# Patient Record
Sex: Male | Born: 1945 | Race: White | Hispanic: No | Marital: Married | Smoking: Never smoker
Health system: Southern US, Community
[De-identification: ages and names within clinical notes are randomized; demographics above are authoritative.]

## PROBLEM LIST (undated history)

## (undated) DIAGNOSIS — E785 Hyperlipidemia, unspecified: Secondary | ICD-10-CM

## (undated) DIAGNOSIS — R748 Abnormal levels of other serum enzymes: Secondary | ICD-10-CM

## (undated) DIAGNOSIS — I2510 Atherosclerotic heart disease of native coronary artery without angina pectoris: Secondary | ICD-10-CM

## (undated) HISTORY — DX: Atherosclerotic heart disease of native coronary artery without angina pectoris: I25.10

## (undated) HISTORY — PX: KNEE SURGERY: SHX244

## (undated) HISTORY — PX: VASECTOMY: SHX75

## (undated) HISTORY — DX: Hyperlipidemia, unspecified: E78.5

## (undated) HISTORY — PX: EYE SURGERY: SHX253

## (undated) HISTORY — PX: TONSILLECTOMY: SHX5217

## (undated) HISTORY — DX: Abnormal levels of other serum enzymes: R74.8

---
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
## 2002-08-18 ENCOUNTER — Ambulatory Visit (HOSPITAL_COMMUNITY): Admission: RE | Admit: 2002-08-18 | Discharge: 2002-08-18 | Payer: Self-pay | Admitting: Gastroenterology

## 2003-12-25 ENCOUNTER — Ambulatory Visit (HOSPITAL_COMMUNITY): Admission: RE | Admit: 2003-12-25 | Discharge: 2003-12-26 | Payer: Self-pay | Admitting: Cardiovascular Disease

## 2003-12-25 HISTORY — PX: CORONARY ANGIOPLASTY WITH STENT PLACEMENT: SHX49

## 2004-09-22 ENCOUNTER — Inpatient Hospital Stay (HOSPITAL_COMMUNITY): Admission: EM | Admit: 2004-09-22 | Discharge: 2004-09-27 | Payer: Self-pay | Admitting: Emergency Medicine

## 2004-09-23 HISTORY — PX: CARDIAC CATHETERIZATION: SHX172

## 2004-09-26 HISTORY — PX: CORONARY ANGIOPLASTY: SHX604

## 2004-11-17 ENCOUNTER — Ambulatory Visit: Payer: Self-pay | Admitting: Cardiology

## 2009-07-18 ENCOUNTER — Emergency Department (HOSPITAL_BASED_OUTPATIENT_CLINIC_OR_DEPARTMENT_OTHER): Admission: EM | Admit: 2009-07-18 | Discharge: 2009-07-18 | Payer: Self-pay | Admitting: Emergency Medicine

## 2009-07-18 ENCOUNTER — Ambulatory Visit: Payer: Self-pay | Admitting: Diagnostic Radiology

## 2010-08-24 ENCOUNTER — Ambulatory Visit: Payer: Self-pay | Admitting: Cardiovascular Disease

## 2010-08-26 ENCOUNTER — Ambulatory Visit: Payer: Self-pay | Admitting: Cardiovascular Disease

## 2010-09-28 ENCOUNTER — Ambulatory Visit: Payer: Self-pay | Admitting: Cardiovascular Disease

## 2010-11-08 ENCOUNTER — Ambulatory Visit: Payer: Self-pay | Admitting: Cardiovascular Disease

## 2011-03-17 NOTE — Op Note (Signed)
   NAMEKERMITT, William Bernard                             ACCOUNT NO.:  [id number]   MEDICAL RECORD NO.:  [id number]                   PATIENT TYPE:  AMB   LOCATION:  ENDO                                 FACILITY:  [facility]   PHYSICIAN:  James L. Malon Kindle., M.D.          DATE OF BIRTH:  January 12, 1946   DATE OF PROCEDURE:  08/18/2002  DATE OF DISCHARGE:                                 OPERATIVE REPORT   PROCEDURE:  Colonoscopy.   MEDICATIONS:  Fentanyl 50 mcg, Versed 5 mg IV.   INDICATIONS:  Colon cancer screening.   DESCRIPTION OF PROCEDURE:  The procedure had been explained to the patient  and consent obtained.  With the patient in the left lateral decubitus  position, the Olympus adult video colonoscope inserted, advanced under  direct visualization.  Prep excellent.  We were able to reach the cecum  without difficulty.  The ileocecal valve and appendiceal orifice seen.  Scope withdrawn and cecum, ascending colon, hepatic flexure, transverse  colon, splenic flexure, descending and sigmoid colon were seen well.  No  polyps seen, no significant diverticular disease.  Small internal  hemorrhoids in the rectum.  The scope withdrawn.  The patient tolerated the  procedure well.   ASSESSMENT:  Essentially normal screening colonoscopy.   PLAN:  Obtain yearly Hemoccults.  Consider another screening test in 10  years if appropriate.                                               James L. Malon Kindle., M.D.    Waldron Session  D:  08/18/2002  T:  08/18/2002  Job:  [id number]   cc:   Caryn Bee L. Little, M.D.  [address]  [place]  [place] [address]  Fax: [phone]

## 2011-03-17 NOTE — Cardiovascular Report (Signed)
William Bernard, William Bernard NO.:  [id number]   MEDICAL RECORD NO.:  [id number]          PATIENT TYPE:  INP   LOCATION:  3739                         FACILITY:  [facility]   PHYSICIAN:  Vesta Mixer, M.D. DATE OF BIRTH:  1946/04/30   DATE OF PROCEDURE:  09/26/2004  DATE OF DISCHARGE:                              CARDIAC CATHETERIZATION   INDICATIONS:  William Bernard is a 65 year old gentleman with a history of  coronary artery disease.  He is status post PTCA and stent placement to his  proximal mid LAD in February of this year.  After he took Plavix for nine  months we stopped the Plavix.  This past weekend he developed chest pain  after running.  He was brought into the catheterization laboratory and was  found to have a subtotal occlusion of his LAD with a subacute thrombosis of  his stent.  He did have antegrade flow at that time.  He was placed on  Integrilin and heparin through the weekend and was brought back today for a  relook.   PROCEDURE:  Left heart catheterization with a PTCA and cutting balloon  procedure to the mid and proximal left anterior descending with PTCA of the  first diagonal vessel.   The right femoral artery was easily cannulated using a modified Seldinger  technique.  The patient received 3200 units of heparin followed by an  additional 1500 units bolus.  He was continued on his Integrilin drip.   ANGIOGRAPHY:  The patient was found to have a 75% stenosis located within  the stent.  The very large thrombus burden has resolved.  The patient was  noted also to have a 95% stenosis in the origin of his first diagonal  vessel.  This is unchanged from his previous angiograms.   The patient received heparin dose as noted above.   The left anterior descending artery was wired using a BMW wire.  A 3.25 x 15  mm cutting balloon was positioned inside the stent.  It was inflated up to 7  atmospheres on four different occasions.  This resulted in a marked  improvement of the vessel lumen.  There was no evidence of embolization.   At this point the wire was placed down into the first diagonal vessel.  A  1.5 x 12 mm Voyager was placed down into the proximal diagonal vessel.  It  was inflated up to 14 atmospheres on two different occasions.  This resulted  in some improvement of the vessel lumen.  This 1.5 mm balloon was removed  and a 2.5 x 12 mm Voyager was positioned back down across the stent out into  the diagonal.  It was inflated up to 6 atmospheres for 32 seconds followed  by 8 atmospheres for 60 seconds.  This resulted in some improvement of the  vessel lumen.  The stenosis was improved from approximately 99% down to 50%.  There was a loss of elastic recoil that limited the amount of success.  I  did not want to place a stent because of location next to the main LAD  stent.  The patient is stable after leaving the laboratory.   COMPLICATIONS:  None.   CONCLUSION:  1.  Successful PTCA and cutting balloon of the proximal left anterior      descending artery.  2.  Partially successful angioplasty involving the first diagonal vessel.      We will continue with Integrilin, Plavix.  He will likely have to take      Plavix lifelong.       PJN/MEDQ  D:  09/26/2004  T:  09/26/2004  Job:  [id number]   cc:   Vikki Ports, M.D.  [address]  [place]  [place] [address]  Fax: [phone]

## 2011-03-17 NOTE — H&P (Signed)
NAMEOMARIE, PARCELL NO.:  [id number]   MEDICAL RECORD NO.:  [id number]                   PATIENT TYPE:  OIB   LOCATION:                                       FACILITY:  [facility]   PHYSICIAN:  Vesta Mixer, M.D.              DATE OF BIRTH:  1946-04-02   DATE OF ADMISSION:  12/25/2003  DATE OF DISCHARGE:                                HISTORY & PHYSICAL   HISTORY OF PRESENT ILLNESS:  William Bernard is a middle aged gentleman with a  history of hyperlipidemia and a family history of coronary artery disease.  He is admitted to the hospital for heart catheterization after having  positive stress Cardiolite study.   Mr. Even is a very healthy middle aged gentleman.  He has been an avid  runner and has run several marathons throughout his life.  For the past  several months he has had episodes of chest pain that bother him with  exercise.  He says that these episodes of chest pain are described as a  vague chest discomfort which is centered in the chest.  It typically occurs  after a few minutes of exercise and then gradually works itself out.  He  usually has to slow down in his running and then when the pain goes away he  is able to resume his running usually without return of the chest pains.  These chest pains have been occurring for several weeks.   He denies any syncope of presyncope.  He does not have any significant  shortness of breath.  He works as in a fairly active job and has not had any  limitations on his job.   CURRENT MEDICATIONS:  1. Aspirin 81 mg a day.  2. Multivitamin once a day.   ALLERGIES:  NONE.   PAST MEDICAL HISTORY:  Hyperlipidemia.   FAMILY HISTORY:  Positive for coronary artery disease.   SOCIAL HISTORY:  The patient does not smoke.  He works for [employer].   REVIEW OF SYSTEMS:  His review of systems was reviewed and is essentially  negative.   PHYSICAL EXAMINATION:  GENERAL:  He is a middle aged gentleman in no  acute  distress.  He is alert and oriented x3 and his mood and affect are normal.  VITAL SIGNS:  Weight is 140 pounds, blood pressure 110/70 with a heart rate  of 54.  HEENT:  2+ carotids, there are no bruits, there is no JVD, no thyromegaly.  LUNGS:  Clear to auscultation.  HEART:  Regular rate, S1 and S2, no murmurs, rubs or gallops.  ABDOMEN:  Good bowel sounds, nontender.  EXTREMITIES:  He has no clubbing, cyanosis or edema.  NEUROLOGIC:  Nonfocal.  Cranial nerves II-XII are intact and his motor and  sensory functions are intact.   His EKG reveals normal sinus rhythm.  He has Q waves in  leads aVL as well as  some T wave inversions.   His stress Cardiolite study reveals anterior apical ischemia with well-  preserved left ventricular systolic function.  His ejection fraction is 50%.   IMPRESSION AND PLAN:  Khalin Royce presents with some episodes of chest pain  and an abnormal Cardiolite study.  The Cardiolite study results were  somewhat surprising given his exercise capacity.  It does show evidence of  reperfusion, however.  We have discussed heart catheterization.  We have  discussed the risks, benefits, and options.  He understands and agrees to  proceed.                                                Vesta Mixer, M.D.    PJN/MEDQ  D:  12/23/2003  T:  12/24/2003  Job:  [id number]   cc:   Vikki Ports, M.D.  [address]  [place]  [place] [address]  Fax: [phone]

## 2011-03-17 NOTE — Cardiovascular Report (Signed)
William Bernard, William Bernard                             ACCOUNT NO.:  [id number]   MEDICAL RECORD NO.:  [id number]                   PATIENT TYPE:  OIB   LOCATION:  2858                                 FACILITY:  [facility]   PHYSICIAN:  Vesta Mixer, M.D.              DATE OF BIRTH:  January 06, 1946   DATE OF PROCEDURE:  12/25/2003  DATE OF DISCHARGE:                              CARDIAC CATHETERIZATION   PROCEDURE:  Left heart catheterization with coronary angiography with  percutaneous transluminal coronary angioplasty and stenting of the left  anterior descending coronary artery. The right femoral artery was easily  cannulated using the modified Seldinger technique.   HEMODYNAMICS:  The LV pressure was 122/5 with an aortic pressure of 122/62.   ANGIOGRAPHY:  1. The left main coronary artery was fairly normal.  2. The left anterior descending coronary artery had very minor irregularity     in the proximal  segment between 20% and 25%. There was a diffuse     stenosis of approximately 30 mm in length. The stenosis started off as a     20% to 30% and then got as tight as 90% to 95% stenosis at the 1st     diagonal branch. There was a thin 50% stenosis extending down past the     septal branch. The 1st diagonal vessel was a moderate to large vessel.     There is a 95% stenosis with TIMI grade 2 flow down that vessel. This     diagonal branch originates right in the middle of the tightest part of     the LAD stenosis/plaque.  3. The left circumflex artery is a large vessel. There are minor luminal     irregularities. The 1st obtuse marginal artery is normal.  4. The ramus intermediate artery is a large vessel and is essentially     normal.  5. The right coronary artery is a large vessel. The posterior descending     coronary artery and the posterolateral segment arteries are normal.  6. The left ventriculogram was performed in the 30 RAO position. It revealed     mild hypokinesis of the anterior  wall with an ejection fraction of 50%.   Percutaneous transluminal coronary angioplasty:  The patient was given  Angiomax as well as 300 mg of Plavix. The 6 [place]  system was traded out  for the 7 [place]  system. A BMW wire was positioned in the distal LAD. Then  a 2nd BMW wire was used in an attempt to cross into the diagonal vessel. We  were not able to cross into this branch vessel.   We then took a 2.75 x 32-mm Taxis stent. It was positioned across the whole  entire length of the LAD stenosis. It was deployed at 14 atmospheres for 28  seconds. We then followed  this with a 3.0 x 15-mm Quantum Maverick. It was  then inflated up to 14 atmospheres distally and then 16 atmospheres  proximally for 15 seconds a piece. We once again attempted to cross into the  diagonal stenosis, but were unable to despite 2 different wires. The flow  down this diagonal vessel is perhaps a TIMI grade 1 at this point. There  were no complications.   CONCLUSION:  Single vessel coronary artery disease involving the LAD and  diagonal system. He is status post  successful  PTCA and stenting of the  LAD. We were not able to get out the diagonal vessel. The patient is  currently  asymptomatic. He may have some chest pain if this small  diagonal vessel  closes. There would not be much to do from an interventional standpoint,  since we were not able to get out that vessel, so we will continue with  medical management.                                               Vesta Mixer, M.D.    PJN/MEDQ  D:  12/25/2003  T:  12/26/2003  Job:  [id number]   cc:   Caryn Bee L. Little, M.D.  [address]  [place]  [place] [address]  Fax: [phone]   Vesta Mixer, M.D.  [address]  [place]  [place] [address]  Fax: [phone]

## 2011-03-17 NOTE — H&P (Signed)
NAMEDELLAS, GUARD NO.:  [id number]   MEDICAL RECORD NO.:  [id number]          PATIENT TYPE:  EMS   LOCATION:  MAJO                         FACILITY:  [facility]   PHYSICIAN:  Darden Palmer., M.D.DATE OF BIRTH:  04-08-1946   DATE OF ADMISSION:  09/22/2004  DATE OF DISCHARGE:                                HISTORY & PHYSICAL   REASON FOR ADMISSION:  Chest pain.   HISTORY:  The patient is a 65 year old male who previously has been in  excellent health, except for hyperlipidemia.  He developed exertional chest  pain and had an abnormal Cardiolite scan with anteroapical ischemia noted.  Catheterization on December 25, 2003, showed a long segmental LAD stenosis  involving the first diagonal branch.  The circumflex, intermediate and right  coronary artery were without disease.  He had stenting of the LAD with a  2.75 x 32 mm Taxus stent and post dilated with a 3.0 x 15 mm balloon.  Dr.  Elease Hashimoto at that time was unable to wire the diagonal branch and was unable to  cross it even after stent placement.  The patient was maintained on Plavix  until about one month ago, at which point he discontinued it.  The patient  has been a avid runner and runs heavily.  He went out today and ran 10 miles  [place].  On returning back from the mile, he developed upper anterior  chest discomfort that persisted.  I should also note that he had done some  pushups after he finished his run.  The chest discomfort persisted and he  took a nitroglycerin and then called.  The discomfort may have abated  somewhat after he called me, but he was advised to call EMS since he was  having ongoing chest pain.  After EMS arrived, they placed oxygen on him and  the pain went away.  He is currently pain-free.  His initial cardiac markers  showed a slightly elevated myoglobin, but troponin and CPK were negative.  He is currently pain-free.  EKG was unremarkable.   PAST HISTORY:  Relatively  benign.  He is a history of hyperlipidemia.  There  is no prior history of hypertension or diabetes.   PREVIOUS SURGICAL HISTORY:  He has had previous knee surgery, as well as  tonsillectomy.   ALLERGIES:  None.   CURRENT MEDICATIONS:  1.  Aspirin 81 mg daily.  2.  Vytorin 10/40 mg daily.   SOCIAL HISTORY:  The patient is a nonsmoker and lives at home with his wife  and two children.  He is retired from [employer] and has recently retired from the  [employer].   FAMILY HISTORY:  Reportedly positive for cardiac disease.   REVIEW OF SYSTEMS:  He has occasional indigestion and dyspepsia.  He denies  any urinary symptoms.  He has no significant arthritis at the present time.  He has no TIAs.  He denies any eye problems and ears, nose and throat  problems.  No skin disorders or problems.  He has not had any recurrence of  chest  pain, palpitations, PND, orthopnea or edema since his catheterization  until today.   PHYSICAL EXAMINATION:  GENERAL APPEARANCE:  He is a pleasant, middle-aged  male who looks physically fit and healthy and is in no acute distress.  VITAL SIGNS:  His blood pressure is currently 110/70 with a pulse of 54.  SKIN:  Warm and dry without obvious mass or lesions.  HEENT:  EOMI.  PERRLA.  CNS clear.  Fundi not examined.  Pharynx negative.  NECK:  Supple without masses.  No JVD, thyromegaly or bruits.  LUNGS:  Clear to A&P.  CARDIOVASCULAR:  Normal S1 and S2.  No S3, S4 or murmur.  ABDOMEN:  Soft and nontender.  No masses, hepatosplenomegaly or aneurysm.  EXTREMITIES:  Femoral and distal pulses 2+.   LABORATORY DATA:  EKG is normal.  Initial point of care enzymes show a white  count of 4600.  Chemistry panel showed slight elevation of SGOT.  Myoglobin  is 233.  CPK-MB and troponin were negative.   IMPRESSION:  1.  Prolonged chest discomfort following prolonged physical exertion, rule      out myocardial infarction or unstable angina.  2.  Coronary artery disease  with previous placement of a long Taxus drug-      eluting stent in the proximal LAD with a jailed diagonal branch      previously.  3.  Hyperlipidemia under treatment.  4.  Elevation of myoglobin that may be due to his long run versus myocardial      ischemia.   RECOMMENDATIONS:  The patient will be begun on Lovenox.  He will be given  beta blockers and aspirin and loaded with Plavix.  Serial markers will be  obtained.  I think he will need to have repeat catheterization to exclude a  problem with his stent, such as late stent thrombosis or restenosis.  He  does have a jailed diagonal branch.      Kristine Royal   WST/MEDQ  D:  09/22/2004  T:  09/22/2004  Job:  [id number]   cc:   Vesta Mixer, M.D.  [address]  [place]  [place] [address]  Fax: [phone]   Anna Genre. Little, M.D.  [address]  [place]  [place] [address]  Fax: [phone]

## 2011-03-17 NOTE — Discharge Summary (Signed)
William Bernard, William Bernard NO.:  [id number]   MEDICAL RECORD NO.:  [id number]          PATIENT TYPE:  INP   LOCATION:  6532                         FACILITY:  [facility]   PHYSICIAN:  Vesta Mixer, M.D. DATE OF BIRTH:  09/12/1946   DATE OF ADMISSION:  09/22/2004  DATE OF DISCHARGE:  09/27/2004                                 DISCHARGE SUMMARY   DISCHARGE DIAGNOSES:  1.  Acute anterior wall myocardial infarction caused by subacute thrombosis      of previously placed left anterior descending stent.  2.  Successful percutaneous transluminal coronary angiography/cutting      balloon angioplasty to the left anterior descending stent.  3.  Dyslipidemia.   DISCHARGE MEDICATIONS:  1.  Vytorin 10/80 mg once a day.  2.  Plavix 75 mg a day.  3.  Enteric-coated aspirin 325 mg a day.  4.  Nitroglycerin 0.4 mg sublingually as needed.  5.  Coenzyme Q10 once a day.   FOLLOWUP:  The patient will return to see Dr. Elease Hashimoto in one to two weeks.  We will consider adding Niaspan 500 mg at night at that time.   DIET:  He is to eat a low-fat, low-cholesterol diet.   ACTIVITY:  He is to resume his exercise.   HISTORY:  [name] is a 65 year old gentleman with a history of coronary  artery disease.  He is status post PTCA and stenting of his left anterior  descending artery in February of this year.  He returned on September 22, 2004, with episodes of chest pain.  Please see the dictated H&P for further  details.   HOSPITAL COURSE BY PROBLEMS:  #1 - CORONARY ARTERY DISEASE:  The patient  stabilized fairly well with medical therapy.  On the following day, he was  taken to the catheterization laboratory by Dr. Donnie Aho.  He was found to have  a 95-99% stenosis involving his LAD stent.  There was a large thrombus  burden located in the stent.  There were minimal luminal irregularities  elsewhere.  The patient was placed on Lovenox and Integrilin for the next  three days.  He was taken  back to the laboratory on September 26, 2004, where  he was found to have almost complete resolution of the LAD thrombus, but he  still had a significant plaque representing a 75% stenosis.  He underwent  cutting balloon angioplasty to the LAD stent.   He had had a severe stenosis in the first diagonal vessel since the original  angioplasty earlier this year.  We were able to wire the diagonal vessel and  place a small 1.5 mm balloon and then later a 2.5 mm balloon down into the  diagonal vessel.  The 2.5 mm balloon was expanded up to 8 atmospheres.  We  were able to get a good dilatation of the balloon, but there was lots of  elastic recoil in the proximal aspect of this diagonal vessel.  The end  result was that we had a partially successful angioplasty of the first  diagonal vessel with a successful cutting  balloon of the LAD stent.  The  patient was sent to the [facility] in satisfactory condition.  He will be  discharged on the above noted medications and disposition.  All of his other  medical problems are stable.   #2 - HYPERLIPIDEMIA:  The patient's most recent LDL cholesterol is 92.  This  was on Vytorin 10/40 mg.  We will increase his Vytorin to 10/80 mg.  We will  consider adding Niaspan 500 mg q.h.s. to his medical regimen.       PJN/MEDQ  D:  09/27/2004  T:  09/27/2004  Job:  [id number]   cc:   Vikki Ports, M.D.  [address]  [place]  [place] [address]  Fax: [phone]

## 2011-03-17 NOTE — Cardiovascular Report (Signed)
William Bernard, William Bernard NO.:  [id number]   MEDICAL RECORD NO.:  [id number]          PATIENT TYPE:  INP   LOCATION:  3739                         FACILITY:  [facility]   PHYSICIAN:  W. Ashley Royalty., M.D.DATE OF BIRTH:  [birth year]   DATE OF PROCEDURE:  09/23/2004  DATE OF DISCHARGE:                              CARDIAC CATHETERIZATION   INDICATIONS:  A 65 year old male with previous stent involving the LAD with  a jailed diagonal branch in February.  He had discontinued Plavix on his  physician's orders about one month ago.  After a 10-mile run and doing some  pushups, he had the onset of anterior chest pain and was brought to the  emergency room.  He had mild elevations of myoglobin, CPK-MB, and troponin I  and was brought to the catheterization lab.   COMMENTS ABOUT PROCEDURE:  The patient tolerated the procedure well without  complications and had good hemostasis and peripheral pulses noted at the end  of the procedure.   Valium 5 mg p.o. and Versed 2 mg IV were used for sedation.   HEMODYNAMIC DATA:  Aorta post contrast 130/73, LV post contrast 130/5 to 12.   ANGIOGRAPHIC DATA:  1.  Left ventriculogram:  Performed in the 30 degree RAO projection.  The      aortic valve was normal.  The mitral valve was normal.  The left      ventricle appears normal in size.  The estimated ejection fraction is      60%.  Coronary arteries arise and distribute normally.  Calcification is      noted in the left anterior descending next to the stent.  2.  Left main coronary artery is normal.  3.  Left anterior descending has a 40% stenosis at the ostium.  The stent is      seen and appears to be well deployed.  In the mid portion of the stent      is a segmental thrombus which is about 70% obstructive and is best seen      in the straight and lateral view but can be seen in all other views.  4.  The diagonal branch that was previously jailed has a 90% ostial stenosis  noted.  The distal portion of the stent is well opposed without      thrombosis.  The proximal portion is without thrombosis, and the      thrombosis is localized and is involving the mid portion of the stent.  5.  The large intermediate branch arises and has a proximal 30% stenosis.  6.  The circumflex has irregularities to it.  7.  The right coronary artery is a dominant vessel.  There is mild 30%      proximal stenosis.  There is a eccentric ulcerated plaque estimated at      20 to 30% in the mid portion of the vessel.   IMPRESSION:  1.  Subacute thrombosis of the previous long Taxus stent to the mid LAD with      residual thrombus.  2.  Residual disease involving  the ostium of the first diagonal branch.  3.  Ulcerated plaque involving the right coronary artery.   RECOMMENDATIONS:  1.  The patient will be taken off the catheterization table.  2.  We will begin him on Integrilin, restart Lovenox.  3.  Have a re-look next week to see if the thrombus has dissolved or if he      will need to have thrombectomy of this      stented segment later on.  4.  The other possibility would be consideration of bypass grafting to both      the diagonal branch as well as the LAD.      Kristine Royal   WST/MEDQ  D:  09/23/2004  T:  09/23/2004  Job:  [id number]   cc:   Vesta Mixer, M.D.  [address]  [place]  [place] [address]  Fax: [phone]   Anna Genre. Little, M.D.  [address]  [place]  [place] [address]  Fax: [phone]

## 2011-05-09 ENCOUNTER — Other Ambulatory Visit: Payer: Self-pay | Admitting: *Deleted

## 2011-05-09 DIAGNOSIS — E785 Hyperlipidemia, unspecified: Secondary | ICD-10-CM

## 2011-05-17 ENCOUNTER — Encounter: Payer: Self-pay | Admitting: *Deleted

## 2011-05-19 ENCOUNTER — Other Ambulatory Visit (INDEPENDENT_AMBULATORY_CARE_PROVIDER_SITE_OTHER): Payer: Managed Care, Other (non HMO) | Admitting: *Deleted

## 2011-05-19 ENCOUNTER — Encounter: Payer: Self-pay | Admitting: Cardiovascular Disease

## 2011-05-19 ENCOUNTER — Ambulatory Visit (INDEPENDENT_AMBULATORY_CARE_PROVIDER_SITE_OTHER): Payer: Managed Care, Other (non HMO) | Admitting: Cardiovascular Disease

## 2011-05-19 VITALS — BP 120/80 | HR 60 | Ht 68.5 in | Wt 138.2 lb

## 2011-05-19 DIAGNOSIS — E785 Hyperlipidemia, unspecified: Secondary | ICD-10-CM

## 2011-05-19 DIAGNOSIS — I2510 Atherosclerotic heart disease of native coronary artery without angina pectoris: Secondary | ICD-10-CM

## 2011-05-19 LAB — BASIC METABOLIC PANEL
BUN: 26 mg/dL — ABNORMAL HIGH (ref 6–23)
CO2: 29 mEq/L (ref 19–32)
Calcium: 9.3 mg/dL (ref 8.4–10.5)
Chloride: 106 mEq/L (ref 96–112)
Creatinine, Ser: 0.9 mg/dL (ref 0.4–1.5)
GFR: 92.49 mL/min (ref >60.00)
Glucose, Bld: 88 mg/dL (ref 70–99)
Potassium: 4.8 mEq/L (ref 3.5–5.1)
Sodium: 141 mEq/L (ref 135–145)

## 2011-05-19 LAB — CK: Total CK: 308 U/L — ABNORMAL HIGH (ref 7–232)

## 2011-05-19 LAB — LIPID PANEL
Cholesterol: 193 mg/dL (ref 0–200)
HDL: 83.9 mg/dL (ref >39.00)
LDL Cholesterol: 101 mg/dL — ABNORMAL HIGH (ref 0–99)
Total CHOL/HDL Ratio: 2
Triglycerides: 42 mg/dL (ref 0.0–149.0)
VLDL: 8.4 mg/dL (ref 0.0–40.0)

## 2011-05-19 LAB — HEPATIC FUNCTION PANEL
ALT: 26 U/L (ref 0–53)
AST: 43 U/L — ABNORMAL HIGH (ref 0–37)
Albumin: 4.3 g/dL (ref 3.5–5.2)
Alkaline Phosphatase: 55 U/L (ref 39–117)
Bilirubin, Direct: 0.1 mg/dL (ref 0.0–0.3)
Total Bilirubin: 1.2 mg/dL (ref 0.3–1.2)
Total Protein: 7.5 g/dL (ref 6.0–8.3)

## 2011-05-19 NOTE — Progress Notes (Signed)
William Bernard Date of Birth  06/15/1946 [facility] [address]     [address] [place], [place]  [address] [phone]  Fax  [phone]  History of Present Illness:  Hx of CAD. S/p stent  And then later a cutting balloon to the  LAD.  Has had reactions to statins but has tolerated atorvastatin 20 QOD.  No muscle weakness.   Current Outpatient Prescriptions  Medication Sig Dispense Refill  . aspirin 81 MG tablet Take 81 mg by mouth daily.        [name] atorvastatin (LIPITOR) 20 MG tablet Take 20 mg by mouth daily. Takes every other day       . Calcium Carbonate-Vitamin D (CALCIUM + D PO) Take 1 tablet by mouth daily.        . clopidogrel (PLAVIX) 75 MG tablet Take 75 mg by mouth daily.        . Coenzyme Q10 (COQ-10 PO) Take by mouth daily.        . fish oil-omega-3 fatty acids 1000 MG capsule Take 1,000 mg by mouth 3 (three) times daily.        [name] KRILL OIL PO Take 1 capsule by mouth daily.        . niacin (NIASPAN) 1000 MG CR tablet Take 1,000 mg by mouth at bedtime.          No Active Allergies He has profound muscle weakness with statins.  Past Medical History  Diagnosis Date  . Coronary artery disease     post PTCA and stenting of his LAD in 2005, he is statuts post Cutting Balloon atherectomy of the stent in November 2005    . Hyperlipidemia   . Elevated CPK   . Elevated liver enzymes     due to multiple statin medication    Past Surgical History  Procedure Date  . Coronary angioplasty with stent placement 12/25/2003    Est. EF of 50%. -- stenting of LAD -- Single vessel coronary artery disease involving the LAD and diagonal system   . Cardiac catheterization 09/23/2004    Subacute thrombosis of the previous long Taxus stent to the mid LAD with residual thrombus -- Residual disease involving the ostium of the first diagonal branch -- Ulcerated plaque involving the right coronary artery.  . Coronary angioplasty 09/26/2004   Successful PTCA and cutting balloon of the proximal left anterior descending artery --  Partially successful angioplasty involving the first diagonal vessel We will continue with Integrilin, Plavix.  He will likely have to take Plavix lifelong  . Knee surgery   . Tonsillectomy   . Vasectomy   . Eye surgery     History  Smoking status  . Never Smoker   Smokeless tobacco  . Not on file    History  Alcohol Use No    Family History  Problem Relation Age of Onset  . Heart attack Father     Reviw of Systems:  Reviewed in the HPI.  All other systems are negative.  Physical Exam: BP 120/80  Pulse 60  Ht 5' 8.5" (1.74 m)  Wt 138 lb 3.2 oz (62.687 kg)  BMI 20.71 kg/m2 The patient is alert and oriented x 3.  The mood and affect are normal.   Skin: warm and dry.  Color is normal.    HEENT:   the sclera are nonicteric.  The mucous membranes are moist.  The carotids are 2+ without bruits.  There is no  thyromegaly.  There is no JVD.    Lungs: clear.  The chest wall is non tender.    Heart: regular rate with a normal S1 and S2.  There are no murmurs, gallops, or rubs. The PMI is not displaced.     Abdomin: good bowel sounds.  There is no guarding or rebound.  There is no hepatosplenomegaly or tenderness.  There are no masses.   Extremities:  no clubbing, cyanosis, or edema.  The legs are without rashes.  The distal pulses are intact.   Neuro:  Cranial nerves II - XII are intact.  Motor and sensory functions are intact.    The gait is normal.  ECG:  Assessment / Plan:

## 2011-05-19 NOTE — Assessment & Plan Note (Signed)
William Bernard is doing very well from a cardiac standpoint.  He denies any chest pain or dyspnea.  We will continue his same medications.  I will see him in 6 months.

## 2011-05-22 ENCOUNTER — Telehealth: Payer: Self-pay | Admitting: Cardiovascular Disease

## 2011-05-22 NOTE — Telephone Encounter (Signed)
Called with results and will take lipitor daily

## 2011-05-22 NOTE — Telephone Encounter (Signed)
Called wanting to know the results of his latest blood work. Please call back.

## 2011-11-08 ENCOUNTER — Other Ambulatory Visit (INDEPENDENT_AMBULATORY_CARE_PROVIDER_SITE_OTHER): Payer: Medicare Other | Admitting: *Deleted

## 2011-11-08 DIAGNOSIS — I2510 Atherosclerotic heart disease of native coronary artery without angina pectoris: Secondary | ICD-10-CM

## 2011-11-08 LAB — BASIC METABOLIC PANEL
BUN: 21 mg/dL (ref 6–23)
CO2: 26 mEq/L (ref 19–32)
Calcium: 8.9 mg/dL (ref 8.4–10.5)
Chloride: 107 mEq/L (ref 96–112)
Creatinine, Ser: 1 mg/dL (ref 0.4–1.5)
GFR: 79.69 mL/min (ref >60.00)
Glucose, Bld: 83 mg/dL (ref 70–99)
Potassium: 4.1 mEq/L (ref 3.5–5.1)
Sodium: 140 mEq/L (ref 135–145)

## 2011-11-08 LAB — LIPID PANEL
Cholesterol: 177 mg/dL (ref 0–200)
HDL: 74.5 mg/dL (ref >39.00)
LDL Cholesterol: 95 mg/dL (ref 0–99)
Total CHOL/HDL Ratio: 2
Triglycerides: 40 mg/dL (ref 0.0–149.0)
VLDL: 8 mg/dL (ref 0.0–40.0)

## 2011-11-08 LAB — HEPATIC FUNCTION PANEL
ALT: 22 U/L (ref 0–53)
AST: 32 U/L (ref 0–37)
Albumin: 4.1 g/dL (ref 3.5–5.2)
Alkaline Phosphatase: 60 U/L (ref 39–117)
Bilirubin, Direct: 0.1 mg/dL (ref 0.0–0.3)
Total Bilirubin: 1.1 mg/dL (ref 0.3–1.2)
Total Protein: 7.2 g/dL (ref 6.0–8.3)

## 2011-11-15 ENCOUNTER — Encounter: Payer: Self-pay | Admitting: Cardiovascular Disease

## 2011-11-15 ENCOUNTER — Ambulatory Visit (INDEPENDENT_AMBULATORY_CARE_PROVIDER_SITE_OTHER): Payer: Medicare Other | Admitting: Cardiovascular Disease

## 2011-11-15 ENCOUNTER — Telehealth: Payer: Self-pay | Admitting: Cardiovascular Disease

## 2011-11-15 ENCOUNTER — Other Ambulatory Visit: Payer: Self-pay | Admitting: *Deleted

## 2011-11-15 DIAGNOSIS — I2510 Atherosclerotic heart disease of native coronary artery without angina pectoris: Secondary | ICD-10-CM

## 2011-11-15 DIAGNOSIS — E785 Hyperlipidemia, unspecified: Secondary | ICD-10-CM

## 2011-11-15 MED ORDER — EZETIMIBE 10 MG PO TABS: 10.0000 mg | ORAL_TABLET | Freq: Every day | ORAL | Status: DC

## 2011-11-15 NOTE — Telephone Encounter (Signed)
PHARMACY ADDED AND SENT NEW RX

## 2011-11-15 NOTE — Progress Notes (Signed)
William Bernard Date of Birth  08/07/1946 [facility]     [place] Office  [address]    [address]   [address] [place], [place]  [address]    [place], [place]  [address] [phone]  Fax  [phone]  [phone]  Fax [phone]   History of Present Illness:  William Bernard is a 66 year old gentleman with a history of coronary artery disease. Status post PTCA and stenting of his left anterior descending artery 2005. He's status post cutting balloon atherectomy to the stent in November of 2005. He also has a history of hypercholesterolemia and elevated CPK levels. He's had elevated liver enzymes with multiple statins.  He's been able to tolerate Lipitor 40 mg a day without too much difficulty.  He has retired since I last saw him.  He still works out on a regular basis - works out at [employer] regularly.  He has cramping in his calves when he runs. He runs on trails which causes him to run on his toes a lot. He thinks that this may be the cause of his calf cramps.   He denies any chest pain with exercise.  Current Outpatient Prescriptions on File Prior to Visit  Medication Sig Dispense Refill  . aspirin 81 MG tablet Take 81 mg by mouth daily.        . clopidogrel (PLAVIX) 75 MG tablet Take 75 mg by mouth daily.        . Coenzyme Q10 (COQ-10 PO) Take by mouth daily.        . fish oil-omega-3 fatty acids 1000 MG capsule Take 1,000 mg by mouth 3 (three) times daily.          No Known Allergies  Past Medical History  Diagnosis Date  . Coronary artery disease     post PTCA and stenting of his LAD in 2005, he is statuts post Cutting Balloon atherectomy of the stent in November 2005    . Hyperlipidemia   . Elevated CPK   . Elevated liver enzymes     due to multiple statin medication    Past Surgical History  Procedure Date  . Coronary angioplasty with stent placement 12/25/2003    Est. EF of 50%. -- stenting of LAD -- Single vessel coronary artery disease involving the  LAD and diagonal system   . Cardiac catheterization 09/23/2004    Subacute thrombosis of the previous long Taxus stent to the mid LAD with residual thrombus -- Residual disease involving the ostium of the first diagonal branch -- Ulcerated plaque involving the right coronary artery.  . Coronary angioplasty 09/26/2004     Successful PTCA and cutting balloon of the proximal left anterior descending artery --  Partially successful angioplasty involving the first diagonal vessel We will continue with Integrilin, Plavix.  He will likely have to take Plavix lifelong  . Knee surgery   . Tonsillectomy   . Vasectomy   . Eye surgery     History  Smoking status  . Never Smoker   Smokeless tobacco  . Not on file    History  Alcohol Use No    Family History  Problem Relation Age of Onset  . Heart attack Father     Reviw of Systems:  Reviewed in the HPI.  All other systems are negative.  Physical Exam: BP 125/79  Pulse 50  Ht 5\' 9"  (1.753 m)  Wt 143 lb 12.8 oz (65.227 kg)  BMI 21.24 kg/m2 The patient  is alert and oriented x 3.  The mood and affect are normal.   Skin: warm and dry.  Color is normal.    HEENT:   [place]/at.  Mucous membranes are moist. His neck is supple. There is no JVD. His carotids are normal   Lungs: Lungs are clear.   Heart: Irregular, no murmurs    Abdomen: His abdomen is thin. He has good bowel sounds. There is no hepatosplenomegaly.  Extremities:  No c/c/e.  His pulses in his feet are excellent. No palpable cords  Neuro:  nonfocal    ECG: Sinus brady with PACs.    His most recent cholesterol levels with LDL of 95. His potassium is 4.1.  Assessment / Plan:

## 2011-11-15 NOTE — Patient Instructions (Addendum)
Your physician recommends that you return for a FASTING lipid profile: 3 MONTHS   Your physician wants you to follow-up in: 6 MONTHS  You will receive a reminder letter in the mail two months in advance. If you don't receive a letter, please call our office to schedule the follow-up appointment.  CALL BACK WITH PHARMACY NAME AND ASK [place] TO REFILL YOUR ZETIA TO THAT PHARMACY

## 2011-11-15 NOTE — Assessment & Plan Note (Addendum)
He is currently on Lipitor 40 mg a day. The patient has been on Crestor which caused him to have severe muscle cramps and also elevations of his total CPK. We will check a total CPK in 3 months and also in 6 months when I see him again for an office visit.

## 2011-11-15 NOTE — Telephone Encounter (Signed)
Pt was in today and to call back with the name of pharmacy he now uses, [facility]

## 2011-11-15 NOTE — Assessment & Plan Note (Signed)
He has not had any episodes of angina. We'll continue with current medications. His LDL is 95. We'll add Zetia 10 mg a day to help bring his LDL down to below 70.  I'm pleased is not having any episodes of angina.

## 2012-02-09 ENCOUNTER — Telehealth: Payer: Self-pay | Admitting: Cardiovascular Disease

## 2012-02-09 NOTE — Telephone Encounter (Signed)
Back to [place].

## 2012-02-09 NOTE — Telephone Encounter (Signed)
Ester at [facility]  aware that Pt may hold Plavix for 5 days prior Colonoscopy and   should continue aspirin. Ester verbalized understanding.

## 2012-02-09 NOTE — Telephone Encounter (Signed)
William Bernard may hold plavix for 5 days prior to colonoscopy.  He should continue aspirin

## 2012-02-09 NOTE — Telephone Encounter (Signed)
Ester, from [facility], would like to know if patient can be off Plavix for 5 to 7 days prior a colonoscopy. Patient's last O/V was 11/15/11 with Dr. Elease Hashimoto.

## 2012-02-09 NOTE — Telephone Encounter (Signed)
New problem:  Per Darral Dash calling from [facility]. Need to speak with Lajoyce Corners, regarding patient medication.

## 2012-02-14 ENCOUNTER — Other Ambulatory Visit: Payer: Medicare Other

## 2013-01-29 ENCOUNTER — Encounter: Payer: Self-pay | Admitting: Cardiovascular Disease

## 2013-02-05 ENCOUNTER — Telehealth: Payer: Self-pay | Admitting: Cardiovascular Disease

## 2013-02-05 NOTE — Telephone Encounter (Signed)
Pt.  was seen by PCP yesterday. An EKG was perform which shows he  Has an irregular hear rate, so pt would like  to be seen soon. Pt has an appointment wit Dr. Elease Hashimoto tomorrow 02/06/13 at 11:45 AM pt aware.

## 2013-02-05 NOTE — Telephone Encounter (Signed)
New problem    C/O irregular heart rate .    Seen PCP on yesterday.

## 2013-02-05 NOTE — Telephone Encounter (Signed)
Left pt a message to call back. 

## 2013-02-06 ENCOUNTER — Ambulatory Visit (INDEPENDENT_AMBULATORY_CARE_PROVIDER_SITE_OTHER): Payer: Medicare Other | Admitting: Cardiovascular Disease

## 2013-02-06 ENCOUNTER — Encounter: Payer: Self-pay | Admitting: Cardiovascular Disease

## 2013-02-06 VITALS — BP 114/84 | HR 64 | Ht 69.0 in | Wt 138.8 lb

## 2013-02-06 DIAGNOSIS — I2510 Atherosclerotic heart disease of native coronary artery without angina pectoris: Secondary | ICD-10-CM

## 2013-02-06 DIAGNOSIS — E785 Hyperlipidemia, unspecified: Secondary | ICD-10-CM

## 2013-02-06 NOTE — Assessment & Plan Note (Signed)
His last LDL is 60 .  He is feeling OK.  Still has slightl muscle aches on occasion. I have encouraged him to continue taking it.  I will see him in 1 year.

## 2013-02-06 NOTE — Assessment & Plan Note (Addendum)
. \\  Reyes is Doing well. He's not having any episodes of chest pain or shortness of breath. We'll continue the same medications. His last LDL is 60. I see him again in one year.

## 2013-02-06 NOTE — Progress Notes (Signed)
William Bernard Date of Birth  1946-03-17 [facility]     [place] Office  [address]    [address]   [address] [place], [place]  [address]    [place], [place]  [address] [phone]  Fax  [phone]  [phone]  Fax [phone]   History of Present Illness:  William Bernard is a 67 year old gentleman with a history of coronary artery disease. Status post PTCA and stenting of his left anterior descending artery 2005. He's status post cutting balloon atherectomy to the stent in November of 2005. He also has a history of hypercholesterolemia and elevated CPK levels. He's had elevated liver enzymes with multiple statins.  He's been able to tolerate Lipitor 40 mg a day without too much difficulty.  He has retired since I last saw him.  He still works out on a regular basis - works out at [employer] regularly.  He has cramping in his calves when he runs. He runs on trails which causes him to run on his toes a lot. He thinks that this may be the cause of his calf cramps.   He denies any chest pain with exercise.  February 06, 2013:  William Bernard is doing well.  He still has lots of muscle cramps associated with the statin.   Current Outpatient Prescriptions on File Prior to Visit  Medication Sig Dispense Refill  . aspirin 81 MG tablet Take 81 mg by mouth daily.        [name] atorvastatin (LIPITOR) 40 MG tablet Take 40 mg by mouth daily.      . clopidogrel (PLAVIX) 75 MG tablet Take 75 mg by mouth daily.        [name] ezetimibe (ZETIA) 10 MG tablet Take 1 tablet (10 mg total) by mouth daily.  90 tablet  3  . fish oil-omega-3 fatty acids 1000 MG capsule Take 1,000 mg by mouth 3 (three) times daily.         No current facility-administered medications on file prior to visit.    No Known Allergies  Past Medical History  Diagnosis Date  . Coronary artery disease     post PTCA and stenting of his LAD in 2005, he is statuts post Cutting Balloon atherectomy of the stent in November 2005    .  Hyperlipidemia   . Elevated CPK   . Elevated liver enzymes     due to multiple statin medication    Past Surgical History  Procedure Laterality Date  . Coronary angioplasty with stent placement  12/25/2003    Est. EF of 50%. -- stenting of LAD -- Single vessel coronary artery disease involving the LAD and diagonal system   . Cardiac catheterization  09/23/2004    Subacute thrombosis of the previous long Taxus stent to the mid LAD with residual thrombus -- Residual disease involving the ostium of the first diagonal branch -- Ulcerated plaque involving the right coronary artery.  . Coronary angioplasty  09/26/2004     Successful PTCA and cutting balloon of the proximal left anterior descending artery --  Partially successful angioplasty involving the first diagonal vessel We will continue with Integrilin, Plavix.  He will likely have to take Plavix lifelong  . Knee surgery    . Tonsillectomy    . Vasectomy    . Eye surgery      History  Smoking status  . Never Smoker   Smokeless tobacco  . Not on file    History  Alcohol Use  No    Family History  Problem Relation Age of Onset  . Heart attack Father     Reviw of Systems:  Reviewed in the HPI.  All other systems are negative.  Physical Exam: BP 114/84  Pulse 64  Ht 5\' 9"  (1.753 m)  Wt 138 lb 12.8 oz (62.959 kg)  BMI 20.49 kg/m2 The patient is alert and oriented x 3.  The mood and affect are normal.   Skin: warm and dry.  Color is normal.    HEENT:   William Bernard/at.  Mucous membranes are moist. His neck is supple. There is no JVD. His carotids are normal   Lungs: Lungs are clear.   Heart: Irregular, no murmurs    Abdomen: His abdomen is thin. He has good bowel sounds. There is no hepatosplenomegaly.  Extremities:  No c/c/e.  His pulses in his feet are excellent. No palpable cords  Neuro:  nonfocal    ECG: February 06, 2013:  Sinus brady with PACs.    I have reviewed the labs from his medical doctor.  His most recent  cholesterol levels with LDL of 60.  Assessment / Plan:

## 2013-02-06 NOTE — Patient Instructions (Addendum)
Your physician wants you to follow-up in: 1 year with ekg  You will receive a reminder letter in the mail two months in advance. If you don't receive a letter, please call our office to schedule the follow-up appointment.  Your physician recommends that you return for a FASTING lipid profile: 1 year   Your physician recommends that you continue on your current medications as directed. Please refer to the Current Medication list given to you today.

## 2014-02-06 ENCOUNTER — Encounter: Payer: Self-pay | Admitting: Cardiovascular Disease

## 2014-02-16 ENCOUNTER — Ambulatory Visit (INDEPENDENT_AMBULATORY_CARE_PROVIDER_SITE_OTHER): Payer: Medicare HMO | Admitting: Cardiovascular Disease

## 2014-02-16 ENCOUNTER — Encounter: Payer: Self-pay | Admitting: Cardiovascular Disease

## 2014-02-16 VITALS — BP 120/80 | HR 45 | Ht 69.0 in | Wt 141.0 lb

## 2014-02-16 DIAGNOSIS — I2510 Atherosclerotic heart disease of native coronary artery without angina pectoris: Secondary | ICD-10-CM

## 2014-02-16 DIAGNOSIS — E785 Hyperlipidemia, unspecified: Secondary | ICD-10-CM

## 2014-02-16 NOTE — Progress Notes (Signed)
William Bernard Date of Birth  April 19, 1946 [place] [address]    [place]   [place], [place]  [address]    [place], [place]  [address] [phone]  Fax  [phone]  [phone]  Fax [phone]   History of Present Illness:  William Bernard is a 68 year old gentleman with a history of coronary artery disease. Status post PTCA and stenting of his left anterior descending artery 2005. He's status post cutting balloon atherectomy to the stent in November of 2005. He also has a history of hypercholesterolemia and elevated CPK levels. He's had elevated liver enzymes with multiple statins.  He's been able to tolerate Lipitor 40 mg a day without too much difficulty.  He has retired since I last saw him.  He still works out on a regular basis - works out at [employer] regularly.  He has cramping in his calves when he runs. He runs on trails which causes him to run on his toes a lot. He thinks that this may be the cause of his calf cramps.   He denies any chest pain with exercise.  February 06, 2013:  William Bernard is doing well.  He still has lots of muscle cramps associated with the statin.   February 16, 2014:  William Bernard is doing well.  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  Still runs, very active.  No CP.   Tolerating the statin ok.  He comments that his muscle strength is gradually deteriorating.  He thinks it is due to his atorvastatin.    Current Outpatient Prescriptions on File Prior to Visit  Medication Sig Dispense Refill  . aspirin 81 MG tablet Take 81 mg by mouth daily.        [name] atorvastatin (LIPITOR) 40 MG tablet Take 40 mg by mouth daily.      . clopidogrel (PLAVIX) 75 MG tablet Take 75 mg by mouth daily.        [name] ezetimibe (ZETIA) 10 MG tablet Take 1 tablet (10 mg total) by mouth daily.  90 tablet  3  . fish oil-omega-3 fatty acids 1000 MG capsule Take 1,000 mg by mouth 3 (three) times daily.         No current facility-administered medications on  file prior to visit.    No Known Allergies  Past Medical History  Diagnosis Date  . Coronary artery disease     post PTCA and stenting of his LAD in 2005, he is statuts post Cutting Balloon atherectomy of the stent in November 2005    . Hyperlipidemia   . Elevated CPK   . Elevated liver enzymes     due to multiple statin medication    Past Surgical History  Procedure Laterality Date  . Coronary angioplasty with stent placement  12/25/2003    Est. EF of 50%. -- stenting of LAD -- Single vessel coronary artery disease involving the LAD and diagonal system   . Cardiac catheterization  09/23/2004    Subacute thrombosis of the previous long Taxus stent to the mid LAD with residual thrombus -- Residual disease involving the ostium of the first diagonal branch -- Ulcerated plaque involving the right coronary artery.  . Coronary angioplasty  09/26/2004     Successful PTCA and cutting balloon of the proximal left anterior descending artery --  Partially successful angioplasty involving the first diagonal vessel We will continue with Integrilin, Plavix.  He will likely have to  take Plavix lifelong  . Knee surgery    . Tonsillectomy    . Vasectomy    . Eye surgery      History  Smoking status  . Never Smoker   Smokeless tobacco  . Not on file    History  Alcohol Use No    Family History  Problem Relation Age of Onset  . Heart attack Father     Reviw of Systems:  Reviewed in the HPI.  All other systems are negative.  Physical Exam: BP 120/80  Pulse 45  Ht 5\' 9"  (1.753 m)  Wt 141 lb (63.957 kg)  BMI 20.81 kg/m2 The patient is alert and oriented x 3.  The mood and affect are normal.   Skin: warm and dry.  Color is normal.    HEENT:   [place]/at.  Mucous membranes are moist. His neck is supple. There is no JVD. His carotids are normal   Lungs: Lungs are clear.   Heart:  RR, , no murmurs    Abdomen: His abdomen is thin. He has good bowel sounds. There is no  hepatosplenomegaly.  Extremities:  No c/c/e.  His pulses in his feet are excellent. No palpable cords  Neuro:  nonfocal    ECG: April 20, j2015:  Sinus brady at 45.  No ST or T wave changes.   I have reviewed the labs from his medical doctor.  His most recent cholesterol levels with LDL of 73.    Assessment / Plan:

## 2014-02-16 NOTE — Patient Instructions (Signed)
Your physician recommends that you schedule a follow-up appointment with Alferd Apa, Pharm-D in the lipid clinic   Your physician wants you to follow-up in: 6 months with Dr. Acie Fredrickson.  You will receive a reminder letter in the mail two months in advance. If you don't receive a letter, please call our office to schedule the follow-up appointment.

## 2014-02-16 NOTE — Assessment & Plan Note (Signed)
His lipids are well controlled on current dose of atorvastatin and Zetia setting up. Unfortunately, he has lots of muscle weakness related to the atorvastatin. We will have him see Alferd Apa, [facility]  in our lipid clinic for further advice.

## 2014-02-16 NOTE — Assessment & Plan Note (Signed)
William Bernard is doing well. She's not having episodes of chest pain or shortness breath. He still active and runs approximately 25 miles a week.  He still is tolerating statins fairly poorly. We will refer him to our lipid clinic for further advice.

## 2014-02-24 ENCOUNTER — Ambulatory Visit (INDEPENDENT_AMBULATORY_CARE_PROVIDER_SITE_OTHER): Payer: Commercial Managed Care - HMO | Admitting: Pharmacist

## 2014-02-24 ENCOUNTER — Ambulatory Visit: Payer: Commercial Managed Care - HMO | Admitting: Pharmacist

## 2014-02-24 VITALS — Wt 141.0 lb

## 2014-02-24 DIAGNOSIS — E785 Hyperlipidemia, unspecified: Secondary | ICD-10-CM

## 2014-02-24 MED ORDER — VITAMIN D 50 MCG (2000 UT) PO CAPS: 2000.0000 [IU] | ORAL_CAPSULE | Freq: Every day | ORAL | Status: DC

## 2014-02-24 MED ORDER — ATORVASTATIN CALCIUM 40 MG PO TABS: 20.0000 mg | ORAL_TABLET | Freq: Every day | ORAL | Status: DC

## 2014-02-24 MED ORDER — CO Q10 200 MG PO CAPS: 200.0000 mg | ORAL_CAPSULE | Freq: Every day | ORAL | Status: DC

## 2014-02-24 NOTE — Progress Notes (Signed)
Patient is a 68 y.o. WM referred to lipid clinic by Dr. Acie Fredrickson given patient has complaints of muscle fatigue on lipitor 40 mg daily.  Patient has been on lipitor for over 4 years, and also taking Zetia 10 mg qd.  His last LDL was 73 mg/dL on 01/2014, and was actually lower at 52 mg/dL on 07/2013.  He admits to eating more fatty foods and eggs over past 6 months which could explain some of the elevation in LDL since late 2014.  Patient is very active and runs 5 days per week, typically 25-30 miles per week.  He c/o muscle fatigue in his legs while running and after running in races and is curious if it is due to lipitor.  He has been on lipitor for years, and is tolerating okay, but wants to know if something can be done to make this even better.  He has a h/o muscle cramping in his legs on simvastatin and crestor in the past as well, and had severe sweating on niaspan in the past.  He has a h/o CK which are stable b/t 200-300, and was 308 while on lipitor 20 mg in 2012.    RF:  CAD, age - LDL goal < 70 mg/dL preferably (non-HDL goal <100) Meds:  Atorvastatin 40 mg qd, Zetia 10 mg qd Intolerant:  Vytorin (muscle aches), Crestor, Niaspan (severe sweating / flushing)  Diet:  Typically eats a low fat diet, and limits fried foods and saturated fats.  Over the last few months however, patient admits to eating a lot of eggs (6-8 per week), which he feels likely contributed to his LDL trending up over past 6 months.  He agrees to cut down on eggs. Exercise:  Wroks out at [employer] and runs 25-30 miles per week typically.  Labs (from [facility] - Dr. Charlcie Cradle):  01/2014 - TC 158, LDL 73, TG ?, HDL 73 (lipitor 40 mg qd, Zetia 10 mg qd) 07/2013 - TC 141, LDL 52, TG 49, HDL 73 (lipitor 40 mg qd, Zetia 10 mg qd) 01/2013 -TC 130, LDL 60, TG 40, HDL 62 (Lipitor 40 mg qd, Zetia 10 mg qd) 04/2011 - CPK 308 (lipitor 20 mg qd)  Current Outpatient Prescriptions  Medication Sig Dispense Refill  . aspirin  81 MG tablet Take 81 mg by mouth daily.        [name] atorvastatin (LIPITOR) 40 MG tablet Take 40 mg by mouth daily.      . clopidogrel (PLAVIX) 75 MG tablet Take 75 mg by mouth daily.        [name] ezetimibe (ZETIA) 10 MG tablet Take 1 tablet (10 mg total) by mouth daily.  90 tablet  3  . fish oil-omega-3 fatty acids 1000 MG capsule Take 1,000 mg by mouth 3 (three) times daily.         No current facility-administered medications for this visit.   Allergies  Allergen Reactions  . Niaspan [Niacin Er]     Extreme sweating and flushing  . Simvastatin     Muscle aches   Family History  Problem Relation Age of Onset  . Heart attack Father

## 2014-02-24 NOTE — Patient Instructions (Addendum)
1.  Start Co-Enzyme Q-10 at dose of 200 mg daily. 2.  Add Vitamin D 2,000 units once daily - have Family Doctor check Vitamin D level with labs later this year. 3.  Cut lipitor to 20 mg daily (1/2 of 40 mg pill) until next blood work.  Continue Zetia daily.  If LDL trends up well over 70 mg/dL, or if no improvement in muscles at that time, will likely need to increase back to lipitor 40 mg daily. 4.  Limit egg consumption - use egg whites, not yolks. 5.  Recheck cholesterol, liver, and Vitamin D 25-OH with future labs with PCP.

## 2014-02-24 NOTE — Assessment & Plan Note (Addendum)
The combination of atorvastatin 40 mg qd and Zetia 10 mg qd has dropped his cholesterol significantly, and LDL and non-HDL have been well controlled.  Side effects, including muscle aches, with statins are typically dose related, so a lower dosage of atorvastatin may cause less muscle fatigue and soreness for this active runner.  Hopefully a lower dosage of Lipitor won't make a large impact if he is able to also lower his saturated fat and egg consumption moving forward as well.  Patients with a h/o low Vitamin D typically get more muscle soreness in legs with statin use as well.  Don't have any h/o vitamin D 25-OH level on patient.  Would like to start him on low dose vitamin D supplement, and would like to get a level in the future to see if he needs a larger dose in the future.  He is taking some Co-Q 10 periodically he tells me, but doesn't know the dose.  This may also help with muscle recovery, and typically seen help with doses of 200 mg a day.  He is already due to see his primary care physician (Dr. Vista Lawman) in 6 months and would like to recheck blood work at that time.  If he needs or wants to f/u with me again regarding lipid lowering therapy, he is to call. Plan: 1.  Start Co-Enzyme Q-10 at dose of 200 mg daily. 2.  Add Vitamin D 2,000 units once daily - have Family Doctor check Vitamin D level with labs later this year. 3.  Cut lipitor to 20 mg daily (1/2 of 40 mg pill) until next blood work.  Continue Zetia daily.  If LDL trends up well over 70 mg/dL, or if no improvement in muscles at that time, will likely need to increase back to lipitor 40 mg daily. 4.  Limit egg consumption - use egg whites only, not yolks. 5.  Recheck cholesterol, liver, and Vitamin D 25-OH with future labs with PCP.

## 2014-08-24 ENCOUNTER — Encounter: Payer: Self-pay | Admitting: Cardiovascular Disease

## 2014-08-24 ENCOUNTER — Ambulatory Visit (INDEPENDENT_AMBULATORY_CARE_PROVIDER_SITE_OTHER): Payer: Medicare HMO | Admitting: Cardiovascular Disease

## 2014-08-24 VITALS — BP 118/82 | HR 58 | Ht 69.0 in | Wt 142.0 lb

## 2014-08-24 DIAGNOSIS — E785 Hyperlipidemia, unspecified: Secondary | ICD-10-CM

## 2014-08-24 DIAGNOSIS — I2510 Atherosclerotic heart disease of native coronary artery without angina pectoris: Secondary | ICD-10-CM

## 2014-08-24 MED ORDER — ATORVASTATIN CALCIUM 10 MG PO TABS: 10.0000 mg | ORAL_TABLET | Freq: Every day | ORAL | Status: DC

## 2014-08-24 NOTE — Patient Instructions (Signed)
Your physician has recommended you make the following change in your medication:  DECREASE Atorvastatin to 10 mg once daily  Your physician recommends that you return for lab work in: 3 months -  You will need to FAST for this appointment - nothing to eat or drink after midnight the night before except water.  Your physician wants you to follow-up in: 6 months with Dr. Acie Fredrickson.  You will receive a reminder letter in the mail two months in advance. If you don't receive a letter, please call our office to schedule the follow-up appointment.

## 2014-08-24 NOTE — Assessment & Plan Note (Signed)
William Bernard has been doing fai and like to decrease the atorvastatin 10 mg a day. rly well. He still has lots of muscle aches and pains. We'll try him on 10 mg of Lipitor and recheck his labs in 3 months. I'll see him in 6 weeks. He brought recent labs with him which are quite good. We'll make a copy of recent labs.

## 2014-08-24 NOTE — Progress Notes (Signed)
William Bernard Date of Birth  1946/03/24 [place] [address]    [place]   [place], [place]  [address]    [place], [place]  [address] [phone]  Fax  [phone]  [phone]  Fax [phone]   History of Present Illness:  William Bernard is a 68 year old gentleman with a history of coronary artery disease. Status post PTCA and stenting of his left anterior descending artery 2005. He's status post cutting balloon atherectomy to the stent in November of 2005. He also has a history of hypercholesterolemia and elevated CPK levels. He's had elevated liver enzymes with multiple statins.  He's been able to tolerate Lipitor 40 mg a day without too much difficulty.  He has retired since I last saw him.  He still works out on a regular basis - works out at [employer] regularly.  He has cramping in his calves when he runs. He runs on trails which causes him to run on his toes a lot. He thinks that this may be the cause of his calf cramps.   He denies any chest pain with exercise.  February 06, 2013:  William Bernard is doing well.  He still has lots of muscle cramps associated with the statin.   February 16, 2014:  William Bernard is doing well.  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  Still runs,   Oct. 26, 2015:  William Bernard is doing well.  Usual aches and pains.  Still exercising.  He cut his atorvastatin to 20 mg last year ( at the recommendation of [employer]). Also started taking Vit D Also taking Zetia   Current Outpatient Prescriptions on File Prior to Visit  Medication Sig Dispense Refill  . aspirin 81 MG tablet Take 81 mg by mouth daily.        [name] atorvastatin (LIPITOR) 40 MG tablet Take 0.5 tablets (20 mg total) by mouth daily.      . Cholecalciferol (VITAMIN D) 2000 UNITS CAPS Take 1 capsule (2,000 Units total) by mouth daily.  30 capsule    . clopidogrel (PLAVIX) 75 MG tablet Take 75 mg by mouth daily.        . Coenzyme Q10 (CO Q10) 200 MG CAPS Take 200 mg  by mouth daily.      . fish oil-omega-3 fatty acids 1000 MG capsule Take 1,000 mg by mouth 3 (three) times daily.        [name] ezetimibe (ZETIA) 10 MG tablet Take 1 tablet (10 mg total) by mouth daily.  90 tablet  3   No current facility-administered medications on file prior to visit.    Allergies  Allergen Reactions  . Niaspan [Niacin Er]     Extreme sweating and flushing  . Simvastatin     Muscle aches    Past Medical History  Diagnosis Date  . Coronary artery disease     post PTCA and stenting of his LAD in 2005, he is statuts post Cutting Balloon atherectomy of the stent in November 2005    . Hyperlipidemia   . Elevated CPK   . Elevated liver enzymes     due to multiple statin medication    Past Surgical History  Procedure Laterality Date  . Coronary angioplasty with stent placement  12/25/2003    Est. EF of 50%. -- stenting of LAD -- Single vessel coronary artery disease involving the LAD and diagonal system   . Cardiac catheterization  09/23/2004  Subacute thrombosis of the previous long Taxus stent to the mid LAD with residual thrombus -- Residual disease involving the ostium of the first diagonal branch -- Ulcerated plaque involving the right coronary artery.  . Coronary angioplasty  09/26/2004     Successful PTCA and cutting balloon of the proximal left anterior descending artery --  Partially successful angioplasty involving the first diagonal vessel We will continue with Integrilin, Plavix.  He will likely have to take Plavix lifelong  . Knee surgery    . Tonsillectomy    . Vasectomy    . Eye surgery      History  Smoking status  . Never Smoker   Smokeless tobacco  . Not on file    History  Alcohol Use No    Family History  Problem Relation Age of Onset  . Heart attack Father     Reviw of Systems:  Reviewed in the HPI.  All other systems are negative.  Physical Exam: BP 118/82  Pulse 58  Ht 5\' 9"  (1.753 m)  Wt 142 lb (64.411 kg)  BMI 20.96  kg/m2 The patient is alert and oriented x 3.  The mood and affect are normal.   Skin: warm and dry.  Color is normal.    HEENT:   [place]/at.  Mucous membranes are moist. His neck is supple. There is no JVD. His carotids are normal   Lungs: Lungs are clear.   Heart:  RR, , no murmurs    Abdomen: His abdomen is thin. He has good bowel sounds. There is no hepatosplenomegaly.  Extremities:  No c/c/e.  His pulses in his feet are excellent. No palpable cords  Neuro:  nonfocal    ECG: April 20, j2015:  Sinus brady at 45.  No ST or T wave changes.   I have reviewed the labs from his medical doctor.  His most recent cholesterol levels with LDL of 73.    Assessment / Plan:

## 2014-08-24 NOTE — Assessment & Plan Note (Signed)
We'll reduce the dose of Lipitor to 10 mg a day because of muscle aches.   We'll recheck labs in 3 months. I'll see him in 6 months

## 2014-11-23 ENCOUNTER — Other Ambulatory Visit (INDEPENDENT_AMBULATORY_CARE_PROVIDER_SITE_OTHER): Payer: Medicare HMO | Admitting: *Deleted

## 2014-11-23 ENCOUNTER — Other Ambulatory Visit: Payer: Commercial Managed Care - HMO

## 2014-11-23 DIAGNOSIS — I2510 Atherosclerotic heart disease of native coronary artery without angina pectoris: Secondary | ICD-10-CM | POA: Diagnosis not present

## 2014-11-23 LAB — LIPID PANEL
CHOLESTEROL: 149 mg/dL (ref 0–200)
HDL: 56.5 mg/dL (ref >39.00)
LDL CALC: 80 mg/dL (ref 0–99)
NONHDL: 92.5
Total CHOL/HDL Ratio: 3
Triglycerides: 63 mg/dL (ref 0.0–149.0)
VLDL: 12.6 mg/dL (ref 0.0–40.0)

## 2014-11-23 LAB — HEPATIC FUNCTION PANEL
ALK PHOS: 51 U/L (ref 39–117)
ALT: 26 U/L (ref 0–53)
AST: 35 U/L (ref 0–37)
Albumin: 3.8 g/dL (ref 3.5–5.2)
BILIRUBIN DIRECT: 0.1 mg/dL (ref 0.0–0.3)
BILIRUBIN TOTAL: 0.6 mg/dL (ref 0.2–1.2)
Total Protein: 6.6 g/dL (ref 6.0–8.3)

## 2014-11-23 LAB — BASIC METABOLIC PANEL
BUN: 22 mg/dL (ref 6–23)
CHLORIDE: 105 meq/L (ref 96–112)
CO2: 30 mEq/L (ref 19–32)
Calcium: 9 mg/dL (ref 8.4–10.5)
Creatinine, Ser: 0.88 mg/dL (ref 0.40–1.50)
GFR: 91.5 mL/min (ref >60.00)
Glucose, Bld: 92 mg/dL (ref 70–99)
Potassium: 4.1 mEq/L (ref 3.5–5.1)
Sodium: 138 mEq/L (ref 135–145)

## 2014-11-25 ENCOUNTER — Other Ambulatory Visit: Payer: Commercial Managed Care - HMO

## 2015-01-15 ENCOUNTER — Ambulatory Visit (INDEPENDENT_AMBULATORY_CARE_PROVIDER_SITE_OTHER): Payer: Medicare HMO | Admitting: Cardiovascular Disease

## 2015-01-15 ENCOUNTER — Encounter: Payer: Self-pay | Admitting: Cardiovascular Disease

## 2015-01-15 ENCOUNTER — Ambulatory Visit (INDEPENDENT_AMBULATORY_CARE_PROVIDER_SITE_OTHER): Payer: Medicare HMO | Admitting: Pharmacist

## 2015-01-15 VITALS — BP 150/86 | HR 38 | Ht 69.0 in | Wt 143.0 lb

## 2015-01-15 DIAGNOSIS — I2510 Atherosclerotic heart disease of native coronary artery without angina pectoris: Secondary | ICD-10-CM

## 2015-01-15 DIAGNOSIS — E785 Hyperlipidemia, unspecified: Secondary | ICD-10-CM

## 2015-01-15 NOTE — Progress Notes (Signed)
Cardiology Office Note   Date:  01/15/2015   ID:  William Bernard, DOB 1946/10/03, MRN [id number]  PCP:  Karlene Einstein, MD  Cardiologist:   Thayer Headings, MD   Chief Complaint  Patient presents with  . Coronary Artery Disease   Problem list: 1. Coronary artery disease 2. Hyperlipidemia William Bernard is a 69 year old gentleman with a history of coronary artery disease. Status post PTCA and stenting of his left anterior descending artery 2005. He's status post cutting balloon atherectomy to the stent in November of 2005. He also has a history of hypercholesterolemia and elevated CPK levels. He's had elevated liver enzymes with multiple statins. He's been able to tolerate Lipitor 40 mg a day without too much difficulty.  He has retired since I last saw him. He still works out on a regular basis - works out at [employer] regularly. He has cramping in his calves when he runs. He runs on trails which causes him to run on his toes a lot. He thinks that this may be the cause of his calf cramps. He denies any chest pain with exercise.  February 06, 2013:  William Bernard is doing well. He still has lots of muscle cramps associated with the statin.   February 16, 2014:  William Bernard is doing well. 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 Still runs,   Oct. 26, 2015:  William Bernard is doing well. Usual aches and pains.  Still exercising.  He cut his atorvastatin to 20 mg last year ( at the recommendation of [employer]). Also started taking Vit D Also taking Zetia   January 15, 2015:   William Bernard is a 69 y.o. male who presents for follow up of his CAD and thanksgiving. Still having lots of leg cramps - presumably due to statin.  Not running as much. Spinning mores    Past Medical History  Diagnosis Date  . Coronary artery disease     post PTCA and stenting of his LAD in 2005, he is statuts post Cutting Balloon atherectomy of the stent in November 2005    . Hyperlipidemia   . Elevated CPK   . Elevated liver  enzymes     due to multiple statin medication    Past Surgical History  Procedure Laterality Date  . Coronary angioplasty with stent placement  12/25/2003    Est. EF of 50%. -- stenting of LAD -- Single vessel coronary artery disease involving the LAD and diagonal system   . Cardiac catheterization  09/23/2004    Subacute thrombosis of the previous long Taxus stent to the mid LAD with residual thrombus -- Residual disease involving the ostium of the first diagonal branch -- Ulcerated plaque involving the right coronary artery.  . Coronary angioplasty  09/26/2004     Successful PTCA and cutting balloon of the proximal left anterior descending artery --  Partially successful angioplasty involving the first diagonal vessel We will continue with Integrilin, Plavix.  He will likely have to take Plavix lifelong  . Knee surgery    . Tonsillectomy    . Vasectomy    . Eye surgery       Current Outpatient Prescriptions  Medication Sig Dispense Refill  . aspirin 81 MG tablet Take 81 mg by mouth daily.      [name] atorvastatin (LIPITOR) 10 MG tablet Take 1 tablet (10 mg total) by mouth daily. 90 tablet 3  . Cetirizine HCl (ZYRTEC ALLERGY PO) Take by mouth.    . Cholecalciferol (VITAMIN D)  2000 UNITS CAPS Take 1 capsule (2,000 Units total) by mouth daily. 30 capsule   . clopidogrel (PLAVIX) 75 MG tablet Take 75 mg by mouth daily.      . Coenzyme Q10 (CO Q10) 200 MG CAPS Take 200 mg by mouth daily.    . fish oil-omega-3 fatty acids 1000 MG capsule Take 1,000 mg by mouth 3 (three) times daily.      [name] ZETIA 10 MG tablet Take 10 mg by mouth daily.     No current facility-administered medications for this visit.    Allergies:   Niaspan and Simvastatin    Social History:  The patient  reports that he has never smoked. He does not have any smokeless tobacco history on file. He reports that he does not drink alcohol or use illicit drugs.   Family History:  The patient's family history includes Diabetes  in his mother; Heart failure in his mother; Hyperlipidemia in his mother; Liver disease in his father; Transient ischemic attack in his mother.    ROS:  Please see the history of present illness.    Review of Systems: Constitutional:  denies fever, chills, diaphoresis, appetite change and fatigue.  HEENT: denies photophobia, eye pain, redness, hearing loss, ear pain, congestion, sore throat, rhinorrhea, sneezing, neck pain, neck stiffness and tinnitus.  Respiratory: denies SOB, DOE, cough, chest tightness, and wheezing.  Cardiovascular: denies chest pain, palpitations and leg swelling.  Gastrointestinal: denies nausea, vomiting, abdominal pain, diarrhea, constipation, blood in stool.  Genitourinary: denies dysuria, urgency, frequency, hematuria, flank pain and difficulty urinating.  Musculoskeletal: denies  myalgias, back pain, joint swelling, arthralgias and gait problem.   Skin: denies pallor, rash and wound.  Neurological: denies dizziness, seizures, syncope, weakness, light-headedness, numbness and headaches.   Hematological: denies adenopathy, easy bruising, personal or family bleeding history.  Psychiatric/ Behavioral: denies suicidal ideation, mood changes, confusion, nervousness, sleep disturbance and agitation.       All other systems are reviewed and negative.    PHYSICAL EXAM: VS:  BP 150/86 mmHg  Pulse 38  Ht 5\' 9"  (1.753 m)  Wt 143 lb (64.864 kg)  BMI 21.11 kg/m2 , BMI Body mass index is 21.11 kg/(m^2). GEN: Well nourished, well developed, in no acute distress HEENT: normal Neck: no JVD, carotid bruits, or masses Cardiac: RRR; no murmurs,  HR is very slow. No rubs, or gallops,no edema  Respiratory:  clear to auscultation bilaterally, normal work of breathing GI: soft, nontender, nondistended, + BS MS: no deformity or atrophy Skin: warm and dry, no rash Neuro:  Strength and sensation are intact Psych: normal   EKG:  EKG is ordered today. The ekg ordered today  demonstrates  Sinus brady at 38. Otherwise normal    Recent Labs: 11/23/2014: ALT 26; BUN 22; Creatinine 0.88; Potassium 4.1; Sodium 138    Lipid Panel    Component Value Date/Time   CHOL 149 11/23/2014 1059   TRIG 63.0 11/23/2014 1059   HDL 56.50 11/23/2014 1059   CHOLHDL 3 11/23/2014 1059   VLDL 12.6 11/23/2014 1059   LDLCALC 80 11/23/2014 1059      Wt Readings from Last 3 Encounters:  01/15/15 143 lb (64.864 kg)  08/24/14 142 lb (64.411 kg)  02/24/14 141 lb (63.957 kg)      Other studies Reviewed: Additional studies/ records that were reviewed today include: . Review of the above records demonstrates:    ASSESSMENT AND PLAN:  1. Coronary artery disease- he's not having any episodes of chest discomfort.  Continue current medications.  2. Hyperlipidemia-  He's currently on Zetia and on low-dose atorvastatin. He continues to have muscle cramps on the atorvastatin. He'll look into the price of Praluent.  We will refer him back to lipid clinic. .    Current medicines are reviewed at length with the patient today.  The patient has concerns regarding medicines.  The following changes have been made:  no change  Labs/ tests ordered today include:  No orders of the defined types were placed in this encounter.     Disposition:   FU with me in 6 months.     Signed, Thiago Ragsdale, Wonda Cheng, MD  01/15/2015 10:48 AM    [address], [place], [place]  [address] Phone: [phone]; Fax: [phone]

## 2015-01-15 NOTE — Progress Notes (Signed)
S/O: William Bernard is a very pleasant 69 yo male referred to lipid clinic by Dr. Acie Fredrickson. PMH is significant for CAD and HLD with history of PTCA stenting of LAD in 2005. Patient has been on Lipitor for over 4 years but has still complained of muscle cramps and aches despite dose reduction to 10mg  daily. He is very active and runs 5 days per week. History of CK that is stable between 200-300 and was 308 while on Lipitor in 2012.  Currents meds: Lipitor 10mg  daily, Zetia 10mg  daily Intolerant: Vytorin (muscle aches), Crestor, Niaspan (severe sweating/flushing)  Lipid Panel: 11/23/14 (on Lipitor 10mg  + Zetia 10mg ) TC 149, TG 63, HDL 57, LDL 80  Current Outpatient Prescriptions on File Prior to Visit  Medication Sig Dispense Refill  . aspirin 81 MG tablet Take 81 mg by mouth daily.      [name] atorvastatin (LIPITOR) 10 MG tablet Take 1 tablet (10 mg total) by mouth daily. 90 tablet 3  . Cetirizine HCl (ZYRTEC ALLERGY PO) Take by mouth.    . Cholecalciferol (VITAMIN D) 2000 UNITS CAPS Take 1 capsule (2,000 Units total) by mouth daily. 30 capsule   . clopidogrel (PLAVIX) 75 MG tablet Take 75 mg by mouth daily.      . Coenzyme Q10 (CO Q10) 200 MG CAPS Take 200 mg by mouth daily.    . fish oil-omega-3 fatty acids 1000 MG capsule Take 1,000 mg by mouth daily.     [name] ZETIA 10 MG tablet Take 10 mg by mouth daily.     No current facility-administered medications on file prior to visit.   Allergies  Allergen Reactions  . Niaspan [Niacin Er]     Extreme sweating and flushing  . Simvastatin     Muscle aches   A/P: Patient with CAD and HLD and current LDL of 80 on Lipitor 10mg  and Zetia 10mg . He still reports muscle cramps and aches despite dose reduction of Lipitor down to 10mg . Patient would like to come off of Lipitor and is interested in trying a PCSK9 inhibitor. He will try a trial off of his Lipitor for the next month to see if his myalgias improve. Will recheck lipid panel in 1 month and f/u in lipid  clinic the day after. Consider Praluent at that time.  Megan E. Supple, Pharm.D Clinical Pharmacy Resident Pager: [phone] 01/15/2015 3:51 PM

## 2015-01-15 NOTE — Patient Instructions (Addendum)
Ask your pharmacy  About  Praluent - 75 mg injected every 2 week.   Your physician recommends that you continue on your current medications as directed. Please refer to the Current Medication list given to you today.  Your physician wants you to follow-up in: 6 months with Dr. Acie Fredrickson.  You will receive a reminder letter in the mail two months in advance. If you don't receive a letter, please call our office to schedule the follow-up appointment. Your physician recommends that you return for lab work in: 6 months on the day of or a few days before your office visit with Dr. Acie Fredrickson.  You will need to FAST for this appointment - nothing to eat or drink after midnight the night before except water.  You have been referred to [facility]

## 2015-02-15 ENCOUNTER — Other Ambulatory Visit (INDEPENDENT_AMBULATORY_CARE_PROVIDER_SITE_OTHER): Payer: Medicare HMO | Admitting: *Deleted

## 2015-02-15 DIAGNOSIS — E785 Hyperlipidemia, unspecified: Secondary | ICD-10-CM

## 2015-02-15 LAB — LIPID PANEL
CHOL/HDL RATIO: 3
Cholesterol: 216 mg/dL — ABNORMAL HIGH (ref 0–200)
HDL: 71.8 mg/dL (ref >39.00)
LDL Cholesterol: 132 mg/dL — ABNORMAL HIGH (ref 0–99)
NONHDL: 144.2
Triglycerides: 61 mg/dL (ref 0.0–149.0)
VLDL: 12.2 mg/dL (ref 0.0–40.0)

## 2015-02-16 ENCOUNTER — Ambulatory Visit: Payer: Medicare HMO | Admitting: Pharmacist

## 2015-02-23 ENCOUNTER — Ambulatory Visit: Payer: Commercial Managed Care - HMO | Admitting: Cardiovascular Disease

## 2015-02-25 ENCOUNTER — Telehealth: Payer: Self-pay | Admitting: Pharmacist

## 2015-02-25 NOTE — Telephone Encounter (Signed)
Spoke with pt.  Explained we are still waiting on the approval from the insurance company and will give him an update as soon as I have one.

## 2015-02-25 NOTE — Telephone Encounter (Signed)
New problem    Pt waiting on call from [facility] concerning approval for his Praluent. Please advise pt,

## 2015-03-01 ENCOUNTER — Telehealth: Payer: Self-pay | Admitting: Cardiovascular Disease

## 2015-03-01 NOTE — Telephone Encounter (Signed)
New message      Talk to [facility] regarding praluent.  He has not received a call from [place] to give him the shipping/cost info.  Will you please check to see if they have is info?

## 2015-03-01 NOTE — Telephone Encounter (Signed)
Spoke with [facility].  They should be contacting pt within the next 24 hours to discuss shipment.  Pt is aware of this information.

## 2015-03-02 ENCOUNTER — Telehealth: Payer: Self-pay | Admitting: Cardiovascular Disease

## 2015-03-02 MED ORDER — ALIROCUMAB 75 MG/ML ~~LOC~~ SOPN: 75.0000 mg | PEN_INJECTOR | SUBCUTANEOUS | Status: DC

## 2015-03-02 NOTE — Telephone Encounter (Signed)
Rx sent to pharmacy for pt.  

## 2015-03-02 NOTE — Telephone Encounter (Signed)
New message         Pt is approved for new drug but need a prescription sent to [facility]

## 2015-03-04 ENCOUNTER — Telehealth: Payer: Self-pay | Admitting: Cardiovascular Disease

## 2015-03-04 NOTE — Telephone Encounter (Signed)
Spoke with pt.  He will come by the clinic tomorrow for instructions on how to do the injection.

## 2015-03-04 NOTE — Telephone Encounter (Signed)
New message      Pt received his praluent today.  Want to come by today and let Gay Filler show him how to use it

## 2015-03-08 ENCOUNTER — Telehealth: Payer: Self-pay | Admitting: Pharmacist

## 2015-03-08 NOTE — Telephone Encounter (Signed)
New Message       Pt calling stating that Gay Filler gave him a shot of Praluent on Friday and it came from a company called [facility] at the price of $504.46, pt states he received a phone call from [place] on Friday offering the medication at the price of $299.94, pt would like to deal with [place] and he doesn't know how [facility] got involved. Pt would like to clear this up with Gay Filler, please call back and advise.

## 2015-03-08 NOTE — Telephone Encounter (Signed)
Spoke with pt.  Explained [facility] helped get his medication approved for [place] but that he can get it filled wherever he would like.  The Rx was sent to [facility] for him last week.  He has already received shipment from them.

## 2015-04-05 ENCOUNTER — Other Ambulatory Visit (INDEPENDENT_AMBULATORY_CARE_PROVIDER_SITE_OTHER): Payer: Medicare HMO | Admitting: *Deleted

## 2015-04-05 DIAGNOSIS — E785 Hyperlipidemia, unspecified: Secondary | ICD-10-CM

## 2015-04-05 LAB — HEPATIC FUNCTION PANEL
ALBUMIN: 3.8 g/dL (ref 3.5–5.2)
ALT: 16 U/L (ref 0–53)
AST: 27 U/L (ref 0–37)
Alkaline Phosphatase: 61 U/L (ref 39–117)
BILIRUBIN TOTAL: 0.6 mg/dL (ref 0.2–1.2)
Bilirubin, Direct: 0.1 mg/dL (ref 0.0–0.3)
Total Protein: 6.7 g/dL (ref 6.0–8.3)

## 2015-04-05 LAB — LIPID PANEL
Cholesterol: 189 mg/dL (ref 0–200)
HDL: 66.9 mg/dL (ref >39.00)
LDL CALC: 108 mg/dL — AB (ref 0–99)
NONHDL: 122.1
TRIGLYCERIDES: 72 mg/dL (ref 0.0–149.0)
Total CHOL/HDL Ratio: 3
VLDL: 14.4 mg/dL (ref 0.0–40.0)

## 2015-04-05 NOTE — Addendum Note (Signed)
Addended by: Eulis Foster on: 04/05/2015 08:16 AM   Modules accepted: Orders

## 2015-04-07 ENCOUNTER — Telehealth: Payer: Self-pay | Admitting: Pharmacist

## 2015-04-07 ENCOUNTER — Ambulatory Visit (INDEPENDENT_AMBULATORY_CARE_PROVIDER_SITE_OTHER): Payer: Medicare HMO | Admitting: Pharmacist

## 2015-04-07 DIAGNOSIS — E785 Hyperlipidemia, unspecified: Secondary | ICD-10-CM

## 2015-04-07 NOTE — Telephone Encounter (Signed)
New message      Pt was looking over Dr Elmarie Shiley notes and it said to take zetia.  Pt was seen this am and discussed with Gay Filler not to take zetia.  Which one should pt do?

## 2015-04-07 NOTE — Patient Instructions (Signed)
Change to Praluent 150mg  every 2 weeks.   Recheck your labs after your 3rd dose.

## 2015-04-07 NOTE — Telephone Encounter (Signed)
LMOM with pt.  Zetia was on his list prior to today's appointment so Dr. Acie Fredrickson was unaware he had stopped taking it.  His list was updated during the visit today so it is up to date and no longer includes Zetia.

## 2015-04-07 NOTE — Progress Notes (Signed)
    HPI Mr. William Bernard is a very pleasant 69 yo male referred to lipid clinic by Dr. Acie Fredrickson. PMH is significant for CAD and HLD with history of PTCA stenting of LAD in 2005. Patient took Lipitor for over 4 years but has still complained of muscle cramps and aches despite dose reduction to 10mg  daily. He is very active and runs 5 days per week. History of CK that is stable between 200-300 and was 308 while on Lipitor in 2012.  In March, he stopped his Lipitor due to muscle aches and his LDL increased to 132 mg/dL.  At that time he was started on Praluent 75mg  every 2 weeks.  He has taken a total of 3 injections prior to today's visit.  He states he is doing well.  He had a little bit of an anxious feeling with his first 2 injections but this was better with the 3rd.    Currents meds:Praluent 75mg  every 2 weeks.  Intolerant: Vytorin (muscle aches), Crestor, Niaspan (severe sweating/flushing), Lipitor (muscle aches), Zetia (ineffective)  Lipid Panel: 04/05/15: TC 189, TG 72, HDL 67, LDL 108 (Praluent 75mg  every 2 weeks) 02/15/15: TC 216, TG 61, HDL 72, LDL 132 (Zetia 10mg  daily)  11/23/14: TC 149, TG 63, HDL 57, LDL 80 (on Lipitor 10mg  + Zetia 10mg )  Current Outpatient Prescriptions on File Prior to Visit  Medication Sig Dispense Refill  . Alirocumab (PRALUENT) 75 MG/ML SOPN Inject 75 mg into the skin every 14 (fourteen) days. 2 pen 6  . aspirin 81 MG tablet Take 81 mg by mouth daily.      . Cetirizine HCl (ZYRTEC ALLERGY PO) Take by mouth.    . Cholecalciferol (VITAMIN D) 2000 UNITS CAPS Take 1 capsule (2,000 Units total) by mouth daily. 30 capsule   . clopidogrel (PLAVIX) 75 MG tablet Take 75 mg by mouth daily.      . fish oil-omega-3 fatty acids 1000 MG capsule Take 1,000 mg by mouth daily.      No current facility-administered medications on file prior to visit.   Allergies  Allergen Reactions  . Niaspan [Niacin Er]     Extreme sweating and flushing  . Simvastatin     Muscle aches   A/P: Pt  doing well with PCSK-9 inhibitor but only had ~25% reduction in LDL.  He is no longer on statin and/or Zetia.  Given he has only had 3 injections, we may not be seeing full effects of this dose yet but don't think it will change enough to get him to goal.  Will increase his dose to 150mg  every 2 weeks and recheck labs after his 3rd dose of the new strength.  Pt is agreeable.

## 2015-05-25 ENCOUNTER — Other Ambulatory Visit (INDEPENDENT_AMBULATORY_CARE_PROVIDER_SITE_OTHER): Payer: Medicare HMO | Admitting: *Deleted

## 2015-05-25 DIAGNOSIS — E785 Hyperlipidemia, unspecified: Secondary | ICD-10-CM

## 2015-05-25 LAB — HEPATIC FUNCTION PANEL
ALBUMIN: 3.9 g/dL (ref 3.5–5.2)
ALT: 17 U/L (ref 0–53)
AST: 32 U/L (ref 0–37)
Alkaline Phosphatase: 54 U/L (ref 39–117)
Bilirubin, Direct: 0.2 mg/dL (ref 0.0–0.3)
Total Bilirubin: 1.1 mg/dL (ref 0.2–1.2)
Total Protein: 7 g/dL (ref 6.0–8.3)

## 2015-05-25 LAB — LIPID PANEL
CHOL/HDL RATIO: 3
Cholesterol: 201 mg/dL — ABNORMAL HIGH (ref 0–200)
HDL: 71.1 mg/dL (ref >39.00)
LDL CALC: 118 mg/dL — AB (ref 0–99)
NonHDL: 129.9
TRIGLYCERIDES: 61 mg/dL (ref 0.0–149.0)
VLDL: 12.2 mg/dL (ref 0.0–40.0)

## 2015-05-26 ENCOUNTER — Ambulatory Visit (INDEPENDENT_AMBULATORY_CARE_PROVIDER_SITE_OTHER): Payer: Medicare HMO | Admitting: Pharmacist

## 2015-05-26 DIAGNOSIS — E785 Hyperlipidemia, unspecified: Secondary | ICD-10-CM | POA: Diagnosis not present

## 2015-05-26 MED ORDER — ALIROCUMAB 150 MG/ML ~~LOC~~ SOPN: 150.0000 mg | PEN_INJECTOR | SUBCUTANEOUS | Status: DC

## 2015-05-26 NOTE — Patient Instructions (Signed)
Hold your next 2 Praluent injections, then restart injections on August 26. Come back for blood work on 9/26 - we will call you with the results.  If you have any problems, call lipid clinic at [phone].

## 2015-05-26 NOTE — Progress Notes (Signed)
Patient ID: William Bernard, male   DOB: 10/29/1946, 69 y.o.   MRN: [id number]    HPI William Bernard is a very pleasant 69 yo male referred to lipid clinic by Dr. Acie Fredrickson. PMH is significant for CAD and HLD with history of PTCA stenting of LAD in 2005. Patient took Lipitor for over 4 years but complained of muscle cramps and aches despite dose reduction to 10mg  daily. He is very active and runs 5 days per week. History of CK that is stable between 200-300 and was 308 while on Lipitor in 2012.  In March 2016, he stopped his Lipitor due to muscle aches and his LDL increased to 132 mg/dL.  At that time he was started on Praluent 75mg  every 2 weeks.  After 3 injections, his LDL came down to 108, ~25% reduction. Dose of Praluent was increased to 150mg  every 2 weeks since LDL did not decrease as much as expected. Has taken 3 doses of Praluent 150mg . He states he is doing well with the injections. Has a little bit of an anxious feeling a few hours after taking the injection but this goes away once he goes to sleep.  Currents meds: Praluent 150g every 2 weeks.  Intolerant: Vytorin (muscle aches), Crestor, Niaspan (severe sweating/flushing), Lipitor (muscle aches), Zetia (ineffective)  Lipid Panel: 05/25/15: TC 201, TG 61, HDL 71, LDL 118 (Praluent 150mg  every 2 weeks) 04/05/15: TC 189, TG 72, HDL 67, LDL 108 (Praluent 75mg  every 2 weeks) 02/15/15: TC 216, TG 61, HDL 72, LDL 132 (Zetia 10mg  daily)  11/23/14: TC 149, TG 63, HDL 57, LDL 80 (on Lipitor 10mg  + Zetia 10mg )  Current Outpatient Prescriptions on File Prior to Visit  Medication Sig Dispense Refill  . aspirin 81 MG tablet Take 81 mg by mouth daily.      . Cetirizine HCl (ZYRTEC ALLERGY PO) Take by mouth.    . Cholecalciferol (VITAMIN D) 2000 UNITS CAPS Take 1 capsule (2,000 Units total) by mouth daily. 30 capsule   . clopidogrel (PLAVIX) 75 MG tablet Take 75 mg by mouth daily.      . fish oil-omega-3 fatty acids 1000 MG capsule Take 1,000 mg by mouth daily.        No current facility-administered medications on file prior to visit.   Allergies  Allergen Reactions  . Niaspan [Niacin Er]     Extreme sweating and flushing  . Simvastatin     Muscle aches   A/P: Pt's LDL increased to 118 while on the Praluent 150mg  dose. 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 Neutralizing antibodies were observed in 1.2% of patients in the clinical trials. Will trial patient on a drug holiday for one month, then restart Praluent 150mg  every 2 weeks on August 26th. Will recheck labs after his 3rd dose of this restart.  New prescription sent to [facility] for higher 150mg  dose since patient had been using samples.   Megan E. Supple, PharmD [facility] [address], [place], [place] [address] Phone: [phone]; Fax: [phone] 05/26/2015 9:49 AM

## 2015-07-26 ENCOUNTER — Other Ambulatory Visit (INDEPENDENT_AMBULATORY_CARE_PROVIDER_SITE_OTHER): Payer: Medicare HMO | Admitting: *Deleted

## 2015-07-26 DIAGNOSIS — E785 Hyperlipidemia, unspecified: Secondary | ICD-10-CM

## 2015-07-26 LAB — LIPID PANEL
Cholesterol: 188 mg/dL (ref 0–200)
HDL: 71.4 mg/dL (ref >39.00)
LDL CALC: 99 mg/dL (ref 0–99)
NonHDL: 116.23
Total CHOL/HDL Ratio: 3
Triglycerides: 86 mg/dL (ref 0.0–149.0)
VLDL: 17.2 mg/dL (ref 0.0–40.0)

## 2015-07-26 LAB — HEPATIC FUNCTION PANEL
ALT: 17 U/L (ref 0–53)
AST: 29 U/L (ref 0–37)
Albumin: 3.8 g/dL (ref 3.5–5.2)
Alkaline Phosphatase: 51 U/L (ref 39–117)
BILIRUBIN TOTAL: 0.7 mg/dL (ref 0.2–1.2)
Bilirubin, Direct: 0.2 mg/dL (ref 0.0–0.3)
Total Protein: 6.8 g/dL (ref 6.0–8.3)

## 2015-07-30 ENCOUNTER — Telehealth: Payer: Self-pay | Admitting: Cardiovascular Disease

## 2015-07-30 NOTE — Telephone Encounter (Signed)
New message       Returning a call to Gay Filler?    Not sure who really called yesterday

## 2015-08-03 NOTE — Telephone Encounter (Signed)
Spoke with pt on 9/29 regarding lab results and to continue same dose of Praluent.

## 2015-08-24 ENCOUNTER — Telehealth: Payer: Self-pay | Admitting: Pharmacist

## 2015-08-24 NOTE — Telephone Encounter (Signed)
New problem   Pt need to speak to you concerning his medication Pruluent, want to know if it will be covered through [employer].

## 2015-08-26 NOTE — Telephone Encounter (Signed)
Spoke with pt.  [employer] will cover Praluent so we are fine with him changing plans.  Will need him to send his new card information to [place] once he receives it to start the prior authorization process.

## 2015-09-09 ENCOUNTER — Telehealth: Payer: Self-pay | Admitting: Pharmacist

## 2015-09-09 NOTE — Telephone Encounter (Signed)
New Message  Pt has switched healthcare teams and has to have "card/letter" verified by Gay Filler for certain medication he takes. Please call back and discuss.

## 2015-09-10 NOTE — Telephone Encounter (Signed)
Spoke with pt.  He has mailed a copy of his new insurance card to our office so we can start the prior authorization process for Praluent again.

## 2015-11-02 ENCOUNTER — Other Ambulatory Visit: Payer: Self-pay | Admitting: Pharmacist

## 2015-11-02 MED ORDER — ALIROCUMAB 150 MG/ML ~~LOC~~ SOPN
150.0000 mg | PEN_INJECTOR | SUBCUTANEOUS | Status: DC
Start: 2015-11-02 — End: 2016-10-02

## 2015-11-17 ENCOUNTER — Encounter: Payer: Self-pay | Admitting: Cardiovascular Disease

## 2015-11-17 ENCOUNTER — Other Ambulatory Visit (INDEPENDENT_AMBULATORY_CARE_PROVIDER_SITE_OTHER): Payer: PPO | Admitting: *Deleted

## 2015-11-17 ENCOUNTER — Ambulatory Visit (INDEPENDENT_AMBULATORY_CARE_PROVIDER_SITE_OTHER): Payer: PPO | Admitting: Cardiovascular Disease

## 2015-11-17 VITALS — BP 100/74 | HR 47 | Ht 69.0 in | Wt 143.8 lb

## 2015-11-17 DIAGNOSIS — I2510 Atherosclerotic heart disease of native coronary artery without angina pectoris: Secondary | ICD-10-CM

## 2015-11-17 DIAGNOSIS — E785 Hyperlipidemia, unspecified: Secondary | ICD-10-CM

## 2015-11-17 DIAGNOSIS — I25810 Atherosclerosis of coronary artery bypass graft(s) without angina pectoris: Secondary | ICD-10-CM | POA: Diagnosis not present

## 2015-11-17 LAB — LIPID PANEL
CHOL/HDL RATIO: 2.5 ratio (ref <=5.0)
CHOLESTEROL: 198 mg/dL (ref 125–200)
HDL: 79 mg/dL (ref >=40)
LDL CALC: 108 mg/dL (ref <130)
TRIGLYCERIDES: 57 mg/dL (ref <150)
VLDL: 11 mg/dL (ref <30)

## 2015-11-17 LAB — HEPATIC FUNCTION PANEL
ALT: 21 U/L (ref 9–46)
AST: 33 U/L (ref 10–35)
Albumin: 3.8 g/dL (ref 3.6–5.1)
Alkaline Phosphatase: 47 U/L (ref 40–115)
BILIRUBIN DIRECT: 0.2 mg/dL (ref <=0.2)
Indirect Bilirubin: 0.6 mg/dL (ref 0.2–1.2)
TOTAL PROTEIN: 6.7 g/dL (ref 6.1–8.1)
Total Bilirubin: 0.8 mg/dL (ref 0.2–1.2)

## 2015-11-17 NOTE — Progress Notes (Signed)
Cardiology Office Note   Date:  11/17/2015   ID:  William Bernard, DOB 1946-03-06, MRN [id number]  PCP:  Karlene Einstein, MD  Cardiologist:   Thayer Headings, MD   Chief Complaint  Patient presents with  . Coronary Artery Disease   Problem list: 1. Coronary artery disease 2. Hyperlipidemia William Bernard is a 70 year old gentleman with a history of coronary artery disease. Status post PTCA and stenting of his left anterior descending artery 2005. He's status post cutting balloon atherectomy to the stent in November of 2005. He also has a history of hypercholesterolemia and elevated CPK levels. He's had elevated liver enzymes with multiple statins. He's been able to tolerate Lipitor 40 mg a day without too much difficulty.  He has retired since I last saw him. He still works out on a regular basis - works out at [employer] regularly. He has cramping in his calves when he runs. He runs on trails which causes him to run on his toes a lot. He thinks that this may be the cause of his calf cramps. He denies any chest pain with exercise.  February 06, 2013:  William Bernard is doing well. He still has lots of muscle cramps associated with the statin.   February 16, 2014:  William Bernard is doing well. 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 Still runs,   Oct. 26, 2015:  William Bernard is doing well. Usual aches and pains.  Still exercising.  He cut his atorvastatin to 20 mg last year ( at the recommendation of [employer]). Also started taking Vit D Also taking Zetia   January 15, 2015:   William Bernard is a 70 y.o. male who presents for follow up of his CAD and thanksgiving. Still having lots of leg cramps - presumably due to statin.  Not running as much. Spinning more  Jan. 18, 2017:  Doing well.  Scheduled for blood work  Is on Pralulent and LDL is now 99.   Is being seen in Lipid clinic. Does not have leg / muscle aching since being off the statin . Does not want to restart any statin  No angina.   Very active.    Still exercising regularly   -runs regularly ,    Past Medical History  Diagnosis Date  . Coronary artery disease     post PTCA and stenting of his LAD in 2005, he is statuts post Cutting Balloon atherectomy of the stent in November 2005    . Hyperlipidemia   . Elevated CPK   . Elevated liver enzymes     due to multiple statin medication    Past Surgical History  Procedure Laterality Date  . Coronary angioplasty with stent placement  12/25/2003    Est. EF of 50%. -- stenting of LAD -- Single vessel coronary artery disease involving the LAD and diagonal system   . Cardiac catheterization  09/23/2004    Subacute thrombosis of the previous long Taxus stent to the mid LAD with residual thrombus -- Residual disease involving the ostium of the first diagonal branch -- Ulcerated plaque involving the right coronary artery.  . Coronary angioplasty  09/26/2004     Successful PTCA and cutting balloon of the proximal left anterior descending artery --  Partially successful angioplasty involving the first diagonal vessel We will continue with Integrilin, Plavix.  He will likely have to take Plavix lifelong  . Knee surgery    . Tonsillectomy    . Vasectomy    . Eye surgery  Current Outpatient Prescriptions  Medication Sig Dispense Refill  . Alirocumab (PRALUENT) 150 MG/ML SOPN Inject 150 mg into the skin every 14 (fourteen) days. 2 pen 11  . aspirin 81 MG tablet Take 81 mg by mouth daily.      . Cetirizine HCl (ZYRTEC ALLERGY PO) Take by mouth.    . Cholecalciferol (VITAMIN D) 2000 UNITS CAPS Take 1 capsule (2,000 Units total) by mouth daily. 30 capsule   . clopidogrel (PLAVIX) 75 MG tablet Take 75 mg by mouth daily.      . fish oil-omega-3 fatty acids 1000 MG capsule Take 1,000 mg by mouth daily.      No current facility-administered medications for this visit.    Allergies:   Niaspan and Simvastatin    Social History:  The patient  reports that he has never smoked. He does not  have any smokeless tobacco history on file. He reports that he does not drink alcohol or use illicit drugs.   Family History:  The patient's family history includes Diabetes in his mother; Heart failure in his mother; Hyperlipidemia in his mother; Liver disease in his father; Transient ischemic attack in his mother.    ROS:  Please see the history of present illness.    Review of Systems: Constitutional:  denies fever, chills, diaphoresis, appetite change and fatigue.  HEENT: denies photophobia, eye pain, redness, hearing loss, ear pain, congestion, sore throat, rhinorrhea, sneezing, neck pain, neck stiffness and tinnitus.  Respiratory: denies SOB, DOE, cough, chest tightness, and wheezing.  Cardiovascular: denies chest pain, palpitations and leg swelling.  Gastrointestinal: denies nausea, vomiting, abdominal pain, diarrhea, constipation, blood in stool.  Genitourinary: denies dysuria, urgency, frequency, hematuria, flank pain and difficulty urinating.  Musculoskeletal: denies  myalgias, back pain, joint swelling, arthralgias and gait problem.   Skin: denies pallor, rash and wound.  Neurological: denies dizziness, seizures, syncope, weakness, light-headedness, numbness and headaches.   Hematological: denies adenopathy, easy bruising, personal or family bleeding history.  Psychiatric/ Behavioral: denies suicidal ideation, mood changes, confusion, nervousness, sleep disturbance and agitation.       All other systems are reviewed and negative.    PHYSICAL EXAM: VS:  BP 100/74 mmHg  Pulse 47  Ht 5\' 9"  (1.753 m)  Wt 143 lb 12.8 oz (65.227 kg)  BMI 21.23 kg/m2 , BMI Body mass index is 21.23 kg/(m^2). GEN: Well nourished, well developed, in no acute distress HEENT: normal Neck: no JVD, carotid bruits, or masses Cardiac: RRR; no murmurs,  HR is very slow. No rubs, or gallops,no edema  Respiratory:  clear to auscultation bilaterally, normal work of breathing GI: soft, nontender,  nondistended, + BS MS: no deformity or atrophy Skin: warm and dry, no rash Neuro:  Strength and sensation are intact Psych: normal   EKG:  EKG is ordered today. The ekg ordered today demonstrates  Sinus brady at 47.   Otherwise normal    Recent Labs: 11/23/2014: BUN 22; Creatinine, Ser 0.88; Potassium 4.1; Sodium 138 07/26/2015: ALT 17    Lipid Panel    Component Value Date/Time   CHOL 188 07/26/2015 0742   TRIG 86.0 07/26/2015 0742   HDL 71.40 07/26/2015 0742   CHOLHDL 3 07/26/2015 0742   VLDL 17.2 07/26/2015 0742   LDLCALC 99 07/26/2015 0742      Wt Readings from Last 3 Encounters:  11/17/15 143 lb 12.8 oz (65.227 kg)  01/15/15 143 lb (64.864 kg)  08/24/14 142 lb (64.411 kg)      Other studies Reviewed:  Additional studies/ records that were reviewed today include: . Review of the above records demonstrates:    ASSESSMENT AND PLAN:  1. Coronary artery disease- he's not having any episodes of chest discomfort. Continue current medications.  2. Hyperlipidemia-  Is on Pralulent Labs are ok  I reviewed his labs over the past year from the lipid clinic.  at this time I would not recommend that we start him on a statin. He had significant muscle aches while on even the lowest dose of statin. We may consider adding Zetia if we need additional LDL lowering. He's currently seeing Gay Filler in the lipid clinic.   Current medicines are reviewed at length with the patient today.  The patient has concerns regarding medicines.  The following changes have been made:  no change  Labs/ tests ordered today include:  No orders of the defined types were placed in this encounter.    Disposition:   FU with me in 1 year.    Signed, Nahser, Wonda Cheng, MD  11/17/2015 9:42 AM    [place], [place], [place]  [address] Phone: [phone]; Fax: [phone]

## 2015-11-17 NOTE — Patient Instructions (Signed)
Medication Instructions:  Your physician recommends that you continue on your current medications as directed. Please refer to the Current Medication list given to you today.   Labwork: TODAY - liver panel, cholesterol   Testing/Procedures: None Ordered   Follow-Up: Your physician wants you to follow-up in: 1 year with Dr. Acie Fredrickson.  You will receive a reminder letter in the mail two months in advance. If you don't receive a letter, please call our office to schedule the follow-up appointment.  If you need a refill on your cardiac medications before your next appointment, please call your pharmacy.   Thank you for choosing [facility]! Christen Bame, RN [phone]

## 2015-11-18 ENCOUNTER — Telehealth: Payer: Self-pay | Admitting: Nurse Practitioner

## 2015-11-18 MED ORDER — EZETIMIBE 10 MG PO TABS: 10.0000 mg | ORAL_TABLET | Freq: Every day | ORAL | Status: DC

## 2015-11-18 NOTE — Telephone Encounter (Signed)
Reviewed results and discussed with Dr. Acie Fredrickson.  Patient is on Pralulent as noted in office visit.  Advised him that LDL goal is 70 and that Dr. Acie Fredrickson would like him to add Zetia 10 mg to his regimen and to return in July for repeat fasting lab work.  I advised him to call back prior to follow up with questions or concerns.  He verbalized understanding and agreement.

## 2015-11-18 NOTE — Telephone Encounter (Signed)
-----   Message from Thayer Headings, MD sent at 11/17/2015  5:16 PM EST ----- LDL is slightly elevated.   He is off the Pralulent at this point. Lets check fasting lipids, cmet in 6 months .

## 2015-11-19 DIAGNOSIS — E7800 Pure hypercholesterolemia, unspecified: Secondary | ICD-10-CM | POA: Diagnosis not present

## 2016-03-06 DIAGNOSIS — H11153 Pinguecula, bilateral: Secondary | ICD-10-CM | POA: Diagnosis not present

## 2016-03-06 DIAGNOSIS — H524 Presbyopia: Secondary | ICD-10-CM | POA: Diagnosis not present

## 2016-03-06 DIAGNOSIS — H2513 Age-related nuclear cataract, bilateral: Secondary | ICD-10-CM | POA: Diagnosis not present

## 2016-04-21 DIAGNOSIS — I2510 Atherosclerotic heart disease of native coronary artery without angina pectoris: Secondary | ICD-10-CM | POA: Diagnosis not present

## 2016-04-21 DIAGNOSIS — Z0000 Encounter for general adult medical examination without abnormal findings: Secondary | ICD-10-CM | POA: Diagnosis not present

## 2016-04-21 DIAGNOSIS — N529 Male erectile dysfunction, unspecified: Secondary | ICD-10-CM | POA: Diagnosis not present

## 2016-04-21 DIAGNOSIS — E559 Vitamin D deficiency, unspecified: Secondary | ICD-10-CM | POA: Diagnosis not present

## 2016-04-21 DIAGNOSIS — E7800 Pure hypercholesterolemia, unspecified: Secondary | ICD-10-CM | POA: Diagnosis not present

## 2016-05-15 ENCOUNTER — Telehealth: Payer: Self-pay | Admitting: Cardiovascular Disease

## 2016-05-15 NOTE — Telephone Encounter (Signed)
NeW Message  Pt calling to speak w/ RN- stated that he had labwork done @ PCP- wanted to know if labs sched for 7/20 are still necessary. Please call back and discuss.

## 2016-05-15 NOTE — Telephone Encounter (Signed)
Spoke with patient who states PCP did fasting lab work on 04/22/16 which showed LDL cholesterol 83, triglycerides 41, and total cholesterol 163.  He states we should have received copies.  I advised him to please call back and ask for copies to be faxed to [phone] and that I have cancelled lab appointment on 7/20.  He verbalized understanding and agreement.

## 2016-05-18 ENCOUNTER — Other Ambulatory Visit: Payer: PPO

## 2016-06-08 NOTE — Telephone Encounter (Signed)
Left message at Dr. Vista Lawman' office requesting most recent lab work be faxed to our office.

## 2016-08-30 DIAGNOSIS — H2513 Age-related nuclear cataract, bilateral: Secondary | ICD-10-CM | POA: Diagnosis not present

## 2016-08-30 DIAGNOSIS — H2511 Age-related nuclear cataract, right eye: Secondary | ICD-10-CM | POA: Diagnosis not present

## 2016-08-30 DIAGNOSIS — H18413 Arcus senilis, bilateral: Secondary | ICD-10-CM | POA: Diagnosis not present

## 2016-10-02 ENCOUNTER — Other Ambulatory Visit: Payer: Self-pay | Admitting: Cardiovascular Disease

## 2016-10-09 ENCOUNTER — Telehealth: Payer: Self-pay | Admitting: Cardiovascular Disease

## 2016-10-09 NOTE — Telephone Encounter (Signed)
Left detailed message for patient that I am not aware of any reason for the call on 12/8. I advised him to call back with questions or concerns.

## 2016-10-09 NOTE — Telephone Encounter (Signed)
New Message  Pt states he received a call from the office on 12/8. Please call back to discuss if needed.

## 2016-10-13 ENCOUNTER — Other Ambulatory Visit: Payer: Self-pay | Admitting: Cardiovascular Disease

## 2016-10-17 DIAGNOSIS — H2511 Age-related nuclear cataract, right eye: Secondary | ICD-10-CM | POA: Diagnosis not present

## 2016-10-25 DIAGNOSIS — Z9841 Cataract extraction status, right eye: Secondary | ICD-10-CM | POA: Diagnosis not present

## 2016-10-25 DIAGNOSIS — Z9842 Cataract extraction status, left eye: Secondary | ICD-10-CM | POA: Diagnosis not present

## 2016-10-25 DIAGNOSIS — Z961 Presence of intraocular lens: Secondary | ICD-10-CM | POA: Diagnosis not present

## 2016-10-25 DIAGNOSIS — H2512 Age-related nuclear cataract, left eye: Secondary | ICD-10-CM | POA: Diagnosis not present

## 2016-10-27 DIAGNOSIS — E7800 Pure hypercholesterolemia, unspecified: Secondary | ICD-10-CM | POA: Diagnosis not present

## 2016-11-16 ENCOUNTER — Ambulatory Visit: Payer: PPO | Admitting: Cardiovascular Disease

## 2016-11-21 ENCOUNTER — Ambulatory Visit (INDEPENDENT_AMBULATORY_CARE_PROVIDER_SITE_OTHER): Payer: PPO | Admitting: Cardiovascular Disease

## 2016-11-21 ENCOUNTER — Encounter (INDEPENDENT_AMBULATORY_CARE_PROVIDER_SITE_OTHER): Payer: Self-pay

## 2016-11-21 ENCOUNTER — Encounter: Payer: Self-pay | Admitting: Cardiovascular Disease

## 2016-11-21 VITALS — BP 122/84 | HR 54 | Ht 68.0 in | Wt 142.4 lb

## 2016-11-21 DIAGNOSIS — I2510 Atherosclerotic heart disease of native coronary artery without angina pectoris: Secondary | ICD-10-CM | POA: Diagnosis not present

## 2016-11-21 DIAGNOSIS — E782 Mixed hyperlipidemia: Secondary | ICD-10-CM

## 2016-11-21 NOTE — Progress Notes (Signed)
Cardiology Office Note   Date:  11/21/2016   ID:  William Bernard, DOB [birth year], MRN [id number]  PCP:  Karlene Einstein, MD  Cardiologist:   Mertie Moores, MD   Chief Complaint  Patient presents with  . Coronary Artery Disease   Problem list: 1. Coronary artery disease 2. Hyperlipidemia William Bernard is a 71 year old gentleman with a history of coronary artery disease. Status post PTCA and stenting of his left anterior descending artery 2005. He's status post cutting balloon atherectomy to the stent in November of 2005. He also has a history of hypercholesterolemia and elevated CPK levels. He's had elevated liver enzymes with multiple statins. He's been able to tolerate Lipitor 40 mg a day without too much difficulty.  He has retired since I last saw him. He still works out on a regular basis - works out at [employer] regularly. He has cramping in his calves when he runs. He runs on trails which causes him to run on his toes a lot. He thinks that this may be the cause of his calf cramps. He denies any chest pain with exercise.  February 06, 2013:  William Bernard is doing well. He still has lots of muscle cramps associated with the statin.   February 16, 2014:  William Bernard is doing well. 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 Still runs,   Oct. 26, 2015:  William Bernard is doing well. Usual aches and pains.  Still exercising.  He cut his atorvastatin to 20 mg last year ( at the recommendation of [employer]). Also started taking Vit D Also taking Zetia   January 15, 2015:   William Bernard is a 71 y.o. male who presents for follow up of his CAD and thanksgiving. Still having lots of leg cramps - presumably due to statin.  Not running as much. Spinning more  Jan. 18, 2017:  Doing well.  Scheduled for blood work  Is on Pralulent and LDL is now 99.   Is being seen in Lipid clinic. Does not have leg / muscle aching since being off the statin . Does not want to restart any statin  No angina.   Very active.    Still exercising regularly   -runs regularly ,   Jan. 23, 2018;  Doing well. No episodes of chest pain or shortness breath. He is still exercising on a regular basis. Has several questions Is on Pralulent.   And also is on Zetia.   Last LDL is 86 ([facility])   LDL - 86 Chol - 177 Trigs - 64 HDL - 77 Non - HDL 100  Takes Advil for muscle soreness when he lifts weights.  Questions about his wife,   A bit overweight.    Past Medical History:  Diagnosis Date  . Coronary artery disease    post PTCA and stenting of his LAD in 2005, he is statuts post Cutting Balloon atherectomy of the stent in November 2005    . Elevated CPK   . Elevated liver enzymes    due to multiple statin medication  . Hyperlipidemia     Past Surgical History:  Procedure Laterality Date  . CARDIAC CATHETERIZATION  09/23/2004   Subacute thrombosis of the previous long Taxus stent to the mid LAD with residual thrombus -- Residual disease involving the ostium of the first diagonal branch -- Ulcerated plaque involving the right coronary artery.  . CORONARY ANGIOPLASTY  09/26/2004    Successful PTCA and cutting balloon of the proximal left anterior descending artery --  Partially successful angioplasty involving the first diagonal vessel We will continue with Integrilin, Plavix.  He will likely have to take Plavix lifelong  . CORONARY ANGIOPLASTY WITH STENT PLACEMENT  12/25/2003   Est. EF of 50%. -- stenting of LAD -- Single vessel coronary artery disease involving the LAD and diagonal system   . EYE SURGERY    . KNEE SURGERY    . TONSILLECTOMY    . VASECTOMY       Current Outpatient Prescriptions  Medication Sig Dispense Refill  . aspirin 81 MG tablet Take 81 mg by mouth daily.      . Cetirizine HCl (ZYRTEC ALLERGY PO) Take by mouth.    . Cholecalciferol (VITAMIN D) 2000 UNITS CAPS Take 1 capsule (2,000 Units total) by mouth daily. 30 capsule   . clopidogrel (PLAVIX) 75 MG tablet Take 75 mg by mouth  daily.      . DUREZOL 0.05 % EMUL Apply 1 drop to eye 3 (three) times daily.    [name] ezetimibe (ZETIA) 10 MG tablet Take 1 tablet by mouth daily 90 tablet 0  . fish oil-omega-3 fatty acids 1000 MG capsule Take 1,000 mg by mouth daily.     [name] ketorolac (ACULAR) 0.4 % SOLN Apply 1 drop to eye 3 (three) times daily.    [name] PRALUENT 150 MG/ML SOPN Inject 150mg  into the skin every 14 (fourteen) days 2 pen 11  . prednisoLONE acetate (PRED FORTE) 1 % ophthalmic suspension Place 1 drop into the right eye 3 (three) times daily.     No current facility-administered medications for this visit.     Allergies:   Niaspan [niacin er] and Simvastatin    Social History:  The patient  reports that he has never smoked. He has never used smokeless tobacco. He reports that he does not drink alcohol or use drugs.   Family History:  The patient's family history includes Diabetes in his mother; Heart failure in his mother; Hyperlipidemia in his mother; Liver disease in his father; Transient ischemic attack in his mother.    ROS:  Please see the history of present illness.    Review of Systems: Constitutional:  denies fever, chills, diaphoresis, appetite change and fatigue.  HEENT: denies photophobia, eye pain, redness, hearing loss, ear pain, congestion, sore throat, rhinorrhea, sneezing, neck pain, neck stiffness and tinnitus.  Respiratory: denies SOB, DOE, cough, chest tightness, and wheezing.  Cardiovascular: denies chest pain, palpitations and leg swelling.  Gastrointestinal: denies nausea, vomiting, abdominal pain, diarrhea, constipation, blood in stool.  Genitourinary: denies dysuria, urgency, frequency, hematuria, flank pain and difficulty urinating.  Musculoskeletal: denies  myalgias, back pain, joint swelling, arthralgias and gait problem.   Skin: denies pallor, rash and wound.  Neurological: denies dizziness, seizures, syncope, weakness, light-headedness, numbness and headaches.   Hematological: denies  adenopathy, easy bruising, personal or family bleeding history.  Psychiatric/ Behavioral: denies suicidal ideation, mood changes, confusion, nervousness, sleep disturbance and agitation.       All other systems are reviewed and negative.    PHYSICAL EXAM: VS:  BP 122/84   Pulse (!) 54   Ht 5\' 8"  (1.727 m)   Wt 142 lb 6.4 oz (64.6 kg)   SpO2 98%   BMI 21.65 kg/m  , BMI Body mass index is 21.65 kg/m. GEN: Well nourished, well developed, in no acute distress  HEENT: normal  Neck: no JVD, carotid bruits, or masses Cardiac: RRR; no murmurs,  HR is very slow. No rubs, or gallops,no edema  Respiratory:  clear to auscultation bilaterally, normal work of breathing GI: soft, nontender, nondistended, + BS MS: no deformity or atrophy  Skin: warm and dry, no rash Neuro:  Strength and sensation are intact Psych: normal   EKG:  EKG is ordered today. The ekg ordered today demonstrates  sinus bradycardia at 59. He has a first degree AV block. He has occasional premature atrial contractions.   Recent Labs: No results found for requested labs within last 8760 hours.    Lipid Panel    Component Value Date/Time   CHOL 198 11/17/2015 0951   TRIG 57 11/17/2015 0951   HDL 79 11/17/2015 0951   CHOLHDL 2.5 11/17/2015 0951   VLDL 11 11/17/2015 0951   LDLCALC 108 11/17/2015 0951      Wt Readings from Last 3 Encounters:  11/21/16 142 lb 6.4 oz (64.6 kg)  11/17/15 143 lb 12.8 oz (65.2 kg)  01/15/15 143 lb (64.9 kg)      Other studies Reviewed: Additional studies/ records that were reviewed today include: . Review of the above records demonstrates:    ASSESSMENT AND PLAN:  1. Coronary artery disease- he's not having any episodes of chest discomfort. Continue current medications.  2. Hyperlipidemia-  Is on Pralulent and Zetia  Labs are ok  Current medicines are reviewed at length with the patient today.  The patient has concerns regarding medicines.  The following changes have  been made:  no change  Labs/ tests ordered today include:  No orders of the defined types were placed in this encounter.   Disposition:   FU with me in 1 year.    Signed, Mertie Moores, MD  11/21/2016 10:29 AM    [place] [address], [place], [place]  [address] Phone: [phone]; Fax: [phone]

## 2016-11-21 NOTE — Patient Instructions (Signed)
Medication Instructions:  Your physician recommends that you continue on your current medications as directed. Please refer to the Current Medication list given to you today.   Labwork: Your physician recommends that you return for lab work in: 1 year on the day of or a few days before your office visit with Dr. Nahser.  You will need to FAST for this appointment - nothing to eat or drink after midnight the night before except water.   Testing/Procedures: None Ordered   Follow-Up: Your physician wants you to follow-up in: 1 year with Dr. Nahser.  You will receive a reminder letter in the mail two months in advance. If you don't receive a letter, please call our office to schedule the follow-up appointment.   If you need a refill on your cardiac medications before your next appointment, please call your pharmacy.   Thank you for choosing [facility]! Nayely Dingus, RN [phone]    

## 2017-02-02 DIAGNOSIS — Z9841 Cataract extraction status, right eye: Secondary | ICD-10-CM | POA: Diagnosis not present

## 2017-02-02 DIAGNOSIS — H2512 Age-related nuclear cataract, left eye: Secondary | ICD-10-CM | POA: Diagnosis not present

## 2017-02-02 DIAGNOSIS — Z9842 Cataract extraction status, left eye: Secondary | ICD-10-CM | POA: Diagnosis not present

## 2017-02-02 DIAGNOSIS — Z961 Presence of intraocular lens: Secondary | ICD-10-CM | POA: Diagnosis not present

## 2017-02-05 ENCOUNTER — Other Ambulatory Visit: Payer: Self-pay | Admitting: Cardiovascular Disease

## 2017-03-02 DIAGNOSIS — H2512 Age-related nuclear cataract, left eye: Secondary | ICD-10-CM | POA: Diagnosis not present

## 2017-03-02 DIAGNOSIS — Z9841 Cataract extraction status, right eye: Secondary | ICD-10-CM | POA: Diagnosis not present

## 2017-03-02 DIAGNOSIS — Z9842 Cataract extraction status, left eye: Secondary | ICD-10-CM | POA: Diagnosis not present

## 2017-03-02 DIAGNOSIS — Z961 Presence of intraocular lens: Secondary | ICD-10-CM | POA: Diagnosis not present

## 2017-04-06 DIAGNOSIS — H903 Sensorineural hearing loss, bilateral: Secondary | ICD-10-CM | POA: Diagnosis not present

## 2017-04-10 DIAGNOSIS — Z9841 Cataract extraction status, right eye: Secondary | ICD-10-CM | POA: Diagnosis not present

## 2017-04-10 DIAGNOSIS — Z9842 Cataract extraction status, left eye: Secondary | ICD-10-CM | POA: Diagnosis not present

## 2017-04-10 DIAGNOSIS — Z961 Presence of intraocular lens: Secondary | ICD-10-CM | POA: Diagnosis not present

## 2017-04-12 ENCOUNTER — Other Ambulatory Visit: Payer: Self-pay | Admitting: Cardiovascular Disease

## 2017-04-24 DIAGNOSIS — E559 Vitamin D deficiency, unspecified: Secondary | ICD-10-CM | POA: Diagnosis not present

## 2017-04-24 DIAGNOSIS — K219 Gastro-esophageal reflux disease without esophagitis: Secondary | ICD-10-CM | POA: Diagnosis not present

## 2017-04-24 DIAGNOSIS — E7800 Pure hypercholesterolemia, unspecified: Secondary | ICD-10-CM | POA: Diagnosis not present

## 2017-04-24 DIAGNOSIS — J301 Allergic rhinitis due to pollen: Secondary | ICD-10-CM | POA: Diagnosis not present

## 2017-04-24 DIAGNOSIS — N529 Male erectile dysfunction, unspecified: Secondary | ICD-10-CM | POA: Diagnosis not present

## 2017-04-24 DIAGNOSIS — I2510 Atherosclerotic heart disease of native coronary artery without angina pectoris: Secondary | ICD-10-CM | POA: Diagnosis not present

## 2017-04-24 DIAGNOSIS — Z0000 Encounter for general adult medical examination without abnormal findings: Secondary | ICD-10-CM | POA: Diagnosis not present

## 2017-04-25 DIAGNOSIS — Z125 Encounter for screening for malignant neoplasm of prostate: Secondary | ICD-10-CM | POA: Diagnosis not present

## 2017-04-25 DIAGNOSIS — E7800 Pure hypercholesterolemia, unspecified: Secondary | ICD-10-CM | POA: Diagnosis not present

## 2017-05-01 DIAGNOSIS — J0110 Acute frontal sinusitis, unspecified: Secondary | ICD-10-CM | POA: Diagnosis not present

## 2017-09-03 ENCOUNTER — Other Ambulatory Visit: Payer: Self-pay | Admitting: Cardiovascular Disease

## 2017-09-26 ENCOUNTER — Telehealth: Payer: Self-pay | Admitting: Cardiovascular Disease

## 2017-09-26 DIAGNOSIS — E785 Hyperlipidemia, unspecified: Secondary | ICD-10-CM

## 2017-09-26 NOTE — Telephone Encounter (Signed)
New Message:     Pt wants to know if he needs lab work before his February appointment?

## 2017-09-26 NOTE — Telephone Encounter (Signed)
Left pt a message to call back. 

## 2017-09-27 NOTE — Telephone Encounter (Signed)
Follow up    The orders that are in epic are expired and will not let us schedule from them, I scheduled patient for 12/07/17 can you please attach order to appointment, thank you

## 2017-09-27 NOTE — Telephone Encounter (Signed)
Per OV on 11/21/16  Labwork: Your physician recommends that you return for lab work in: 1 year on the day of or a few days before your office visit with Dr. Acie Fredrickson.  You will need to FAST for this appointment - nothing to eat or drink after midnight the night before except water.   Fasting lipids and CMET ordered.

## 2017-10-06 ENCOUNTER — Other Ambulatory Visit: Payer: Self-pay | Admitting: Cardiovascular Disease

## 2017-11-20 DIAGNOSIS — L570 Actinic keratosis: Secondary | ICD-10-CM | POA: Diagnosis not present

## 2017-11-20 DIAGNOSIS — J069 Acute upper respiratory infection, unspecified: Secondary | ICD-10-CM | POA: Diagnosis not present

## 2017-11-20 DIAGNOSIS — M722 Plantar fascial fibromatosis: Secondary | ICD-10-CM | POA: Diagnosis not present

## 2017-11-30 ENCOUNTER — Other Ambulatory Visit: Payer: PPO | Admitting: *Deleted

## 2017-11-30 DIAGNOSIS — E785 Hyperlipidemia, unspecified: Secondary | ICD-10-CM

## 2017-11-30 LAB — LIPID PANEL
CHOL/HDL RATIO: 2.2 ratio (ref 0.0–5.0)
Cholesterol, Total: 151 mg/dL (ref 100–199)
HDL: 70 mg/dL (ref >39)
LDL CALC: 68 mg/dL (ref 0–99)
Triglycerides: 63 mg/dL (ref 0–149)
VLDL CHOLESTEROL CAL: 13 mg/dL (ref 5–40)

## 2017-11-30 LAB — COMPREHENSIVE METABOLIC PANEL
ALT: 16 IU/L (ref 0–44)
AST: 29 IU/L (ref 0–40)
Albumin/Globulin Ratio: 1.7 (ref 1.2–2.2)
Albumin: 4 g/dL (ref 3.5–4.8)
Alkaline Phosphatase: 62 IU/L (ref 39–117)
BUN/Creatinine Ratio: 21 (ref 10–24)
BUN: 21 mg/dL (ref 8–27)
Bilirubin Total: 0.6 mg/dL (ref 0.0–1.2)
CALCIUM: 9.3 mg/dL (ref 8.6–10.2)
CO2: 23 mmol/L (ref 20–29)
CREATININE: 0.98 mg/dL (ref 0.76–1.27)
Chloride: 102 mmol/L (ref 96–106)
GFR calc Af Amer: 89 mL/min/{1.73_m2} (ref >59)
GFR, EST NON AFRICAN AMERICAN: 77 mL/min/{1.73_m2} (ref >59)
GLOBULIN, TOTAL: 2.4 g/dL (ref 1.5–4.5)
Glucose: 81 mg/dL (ref 65–99)
Potassium: 4.7 mmol/L (ref 3.5–5.2)
SODIUM: 139 mmol/L (ref 134–144)
Total Protein: 6.4 g/dL (ref 6.0–8.5)

## 2017-12-07 ENCOUNTER — Encounter (INDEPENDENT_AMBULATORY_CARE_PROVIDER_SITE_OTHER): Payer: Self-pay

## 2017-12-07 ENCOUNTER — Ambulatory Visit: Payer: PPO | Admitting: Cardiovascular Disease

## 2017-12-07 ENCOUNTER — Telehealth: Payer: Self-pay | Admitting: Pharmacist

## 2017-12-07 ENCOUNTER — Encounter: Payer: Self-pay | Admitting: Cardiovascular Disease

## 2017-12-07 ENCOUNTER — Other Ambulatory Visit: Payer: PPO

## 2017-12-07 VITALS — BP 124/76 | HR 61 | Ht 68.0 in | Wt 143.4 lb

## 2017-12-07 DIAGNOSIS — E782 Mixed hyperlipidemia: Secondary | ICD-10-CM | POA: Diagnosis not present

## 2017-12-07 DIAGNOSIS — I2510 Atherosclerotic heart disease of native coronary artery without angina pectoris: Secondary | ICD-10-CM | POA: Diagnosis not present

## 2017-12-07 MED ORDER — EVOLOCUMAB 140 MG/ML ~~LOC~~ SOAJ: 1.0000 "pen " | SUBCUTANEOUS | 11 refills | Status: DC

## 2017-12-07 NOTE — Telephone Encounter (Signed)
Pt came in to clinic today for a visit with Dr Acie Fredrickson. He mentioned that his Praluent costs $500 per month. He does not qualify for patient assistance. Will submit new prior authorization for Repatha to see if copay is lower. He has been tolerating Praluent well with most recent LDL drawn 1 week ago showing LDL of 68.

## 2017-12-07 NOTE — Patient Instructions (Signed)

## 2017-12-07 NOTE — Addendum Note (Signed)
Addended by: SUPPLE, MEGAN E on: 12/07/2017 04:43 PM   Modules accepted: Orders

## 2017-12-07 NOTE — Telephone Encounter (Addendum)
PA for Repatha approved. Rx sent to specialty pharmacy to see if copay is cheaper. LMOM for pt.

## 2017-12-07 NOTE — Progress Notes (Signed)
Cardiology Office Note   Date:  12/07/2017   ID:  William Bernard, DOB [birth year], MRN [id number]  PCP:  Karlene Einstein, MD  Cardiologist:   Mertie Moores, MD   Chief Complaint  Patient presents with  . Coronary Artery Disease   Problem list: 1. Coronary artery disease 2. Hyperlipidemia William Bernard is a 72 year old gentleman with a history of coronary artery disease. Status post PTCA and stenting of his left anterior descending artery 2005. He's status post cutting balloon atherectomy to the stent in November of 2005. He also has a history of hypercholesterolemia and elevated CPK levels. He's had elevated liver enzymes with multiple statins. He's been able to tolerate Lipitor 40 mg a day without too much difficulty.  He has retired since I last saw him. He still works out on a regular basis - works out at [employer] regularly. He has cramping in his calves when he runs. He runs on trails which causes him to run on his toes a lot. He thinks that this may be the cause of his calf cramps. He denies any chest pain with exercise.  February 06, 2013:  William Bernard is doing well. He still has lots of muscle cramps associated with the statin.   February 16, 2014:  William Bernard is doing well. 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 Still runs,   Oct. 26, 2015:  William Bernard is doing well. Usual aches and pains.  Still exercising.  He cut his atorvastatin to 20 mg last year ( at the recommendation of [employer]). Also started taking Vit D Also taking Zetia   January 15, 2015:   William Bernard is a 72 y.o. male who presents for follow up of his CAD and thanksgiving. Still having lots of leg cramps - presumably due to statin.  Not running as much. Spinning more  Jan. 18, 2017:  Doing well.  Scheduled for blood work  Is on Pralulent and LDL is now 99.   Is being seen in Lipid clinic. Does not have leg / muscle aching since being off the statin . Does not want to restart any statin  No angina.   Very active.    Still exercising regularly   -runs regularly ,   Jan. 23, 2018;  Doing well. No episodes of chest pain or shortness breath. He is still exercising on a regular basis. Has several questions Is on Pralulent.   And also is on Zetia.   Last LDL is 86 ([facility])   LDL - 86 Chol - 177 Trigs - 64 HDL - 77 Non - HDL 100  Takes Advil for muscle soreness when he lifts weights.  Questions about his wife,   A bit overweight.    Feb. 8, 2019:  Doing well,   Is cycling instead of running ( has plantar fascitis)  Notices that he is slowing down.  Doesn't recover as fast when he reaches his aneorobic threshold.    Past Medical History:  Diagnosis Date  . Coronary artery disease    post PTCA and stenting of his LAD in 2005, he is statuts post Cutting Balloon atherectomy of the stent in November 2005    . Elevated CPK   . Elevated liver enzymes    due to multiple statin medication  . Hyperlipidemia     Past Surgical History:  Procedure Laterality Date  . CARDIAC CATHETERIZATION  09/23/2004   Subacute thrombosis of the previous long Taxus stent to the mid LAD with residual thrombus -- Residual  disease involving the ostium of the first diagonal branch -- Ulcerated plaque involving the right coronary artery.  . CORONARY ANGIOPLASTY  09/26/2004    Successful PTCA and cutting balloon of the proximal left anterior descending artery --  Partially successful angioplasty involving the first diagonal vessel We will continue with Integrilin, Plavix.  He will likely have to take Plavix lifelong  . CORONARY ANGIOPLASTY WITH STENT PLACEMENT  12/25/2003   Est. EF of 50%. -- stenting of LAD -- Single vessel coronary artery disease involving the LAD and diagonal system   . EYE SURGERY    . KNEE SURGERY    . TONSILLECTOMY    . VASECTOMY       Current Outpatient Medications  Medication Sig Dispense Refill  . aspirin 81 MG tablet Take 81 mg by mouth daily.      . Cetirizine HCl (ZYRTEC ALLERGY PO)  Take by mouth.    . Cholecalciferol (VITAMIN D) 2000 UNITS CAPS Take 1 capsule (2,000 Units total) by mouth daily. 30 capsule   . clopidogrel (PLAVIX) 75 MG tablet Take 75 mg by mouth daily.      [name] ezetimibe (ZETIA) 10 MG tablet Take 1 tablet by mouth every day 90 tablet 0  . PRALUENT 150 MG/ML SOPN Inject 150mg  subcutaneously every 14 (fourteen) days 2 pen 11   No current facility-administered medications for this visit.     Allergies:   Niaspan [niacin er] and Simvastatin    Social History:  The patient  reports that  has never smoked. he has never used smokeless tobacco. He reports that he does not drink alcohol or use drugs.   Family History:  The patient's family history includes Diabetes in his mother; Heart failure in his mother; Hyperlipidemia in his mother; Liver disease in his father; Transient ischemic attack in his mother.    ROS: as per current hx . Otherwise negativ e  Physical Exam: Blood pressure 124/76, pulse 61, height 5\' 8"  (1.727 m), weight 143 lb 6.4 oz (65 kg).  GEN:  Well nourished, well developed in no acute distress HEENT: Normal NECK: No JVD; No carotid bruits LYMPHATICS: No lymphadenopathy CARDIAC: RR, normal S1S2 RESPIRATORY:  Clear to auscultation without rales, wheezing or rhonchi  ABDOMEN: Soft, non-tender, non-distended MUSCULOSKELETAL:  No edema; No deformity  SKIN: Warm and dry NEUROLOGIC:  Alert and oriented x 3 l   EKG: December 07, 2017: Normal sinus rhythm with sinus arrhythmia.  Left axis deviation.  No ST or T wave changes.   Recent Labs: 11/30/2017: ALT 16; BUN 21; Creatinine, Ser 0.98; Potassium 4.7; Sodium 139    Lipid Panel    Component Value Date/Time   CHOL 151 11/30/2017 0813   TRIG 63 11/30/2017 0813   HDL 70 11/30/2017 0813   CHOLHDL 2.2 11/30/2017 0813   CHOLHDL 2.5 11/17/2015 0951   VLDL 11 11/17/2015 0951   LDLCALC 68 11/30/2017 0813      Wt Readings from Last 3 Encounters:  12/07/17 143 lb 6.4 oz (65 kg)    11/21/16 142 lb 6.4 oz (64.6 kg)  11/17/15 143 lb 12.8 oz (65.2 kg)      Other studies Reviewed: Additional studies/ records that were reviewed today include: . Review of the above records demonstrates:    ASSESSMENT AND PLAN:  1. Coronary artery disease-   Stable,    No angina .    2. Hyperlipidemia-  Is on Pralulent and Zetia  Labs are ok Continue current meds.  Current medicines are reviewed at length with the patient today.  The patient has concerns regarding medicines.  The following changes have been made:  no change  Labs/ tests ordered today include:  No orders of the defined types were placed in this encounter.   Disposition:   FU with me in 1 year.    Signed, Mertie Moores, MD  12/07/2017 9:35 AM    [facility] [place], [place], [place]  [address] Phone: [phone]; Fax: [phone]

## 2017-12-10 DIAGNOSIS — M7752 Other enthesopathy of left foot: Secondary | ICD-10-CM | POA: Diagnosis not present

## 2017-12-10 DIAGNOSIS — M722 Plantar fascial fibromatosis: Secondary | ICD-10-CM | POA: Diagnosis not present

## 2017-12-17 NOTE — Addendum Note (Signed)
Addended by: De Burrs on: 12/17/2017 02:10 PM   Modules accepted: Orders

## 2017-12-19 DIAGNOSIS — M722 Plantar fascial fibromatosis: Secondary | ICD-10-CM | POA: Diagnosis not present

## 2017-12-19 DIAGNOSIS — M7752 Other enthesopathy of left foot: Secondary | ICD-10-CM | POA: Diagnosis not present

## 2018-01-09 DIAGNOSIS — M7752 Other enthesopathy of left foot: Secondary | ICD-10-CM | POA: Diagnosis not present

## 2018-01-09 DIAGNOSIS — M722 Plantar fascial fibromatosis: Secondary | ICD-10-CM | POA: Diagnosis not present

## 2018-01-21 DIAGNOSIS — J309 Allergic rhinitis, unspecified: Secondary | ICD-10-CM | POA: Diagnosis not present

## 2018-01-21 DIAGNOSIS — J209 Acute bronchitis, unspecified: Secondary | ICD-10-CM | POA: Diagnosis not present

## 2018-01-29 ENCOUNTER — Other Ambulatory Visit: Payer: Self-pay | Admitting: Cardiovascular Disease

## 2018-02-04 DIAGNOSIS — R05 Cough: Secondary | ICD-10-CM | POA: Diagnosis not present

## 2018-02-04 DIAGNOSIS — J301 Allergic rhinitis due to pollen: Secondary | ICD-10-CM | POA: Diagnosis not present

## 2018-02-04 DIAGNOSIS — J3089 Other allergic rhinitis: Secondary | ICD-10-CM | POA: Diagnosis not present

## 2018-02-04 DIAGNOSIS — J3081 Allergic rhinitis due to animal (cat) (dog) hair and dander: Secondary | ICD-10-CM | POA: Diagnosis not present

## 2018-02-14 DIAGNOSIS — J3089 Other allergic rhinitis: Secondary | ICD-10-CM | POA: Diagnosis not present

## 2018-02-14 DIAGNOSIS — J301 Allergic rhinitis due to pollen: Secondary | ICD-10-CM | POA: Diagnosis not present

## 2018-02-14 DIAGNOSIS — J3081 Allergic rhinitis due to animal (cat) (dog) hair and dander: Secondary | ICD-10-CM | POA: Diagnosis not present

## 2018-02-18 DIAGNOSIS — J301 Allergic rhinitis due to pollen: Secondary | ICD-10-CM | POA: Diagnosis not present

## 2018-02-18 DIAGNOSIS — J3089 Other allergic rhinitis: Secondary | ICD-10-CM | POA: Diagnosis not present

## 2018-02-18 DIAGNOSIS — J3081 Allergic rhinitis due to animal (cat) (dog) hair and dander: Secondary | ICD-10-CM | POA: Diagnosis not present

## 2018-02-20 DIAGNOSIS — J3081 Allergic rhinitis due to animal (cat) (dog) hair and dander: Secondary | ICD-10-CM | POA: Diagnosis not present

## 2018-02-20 DIAGNOSIS — J3089 Other allergic rhinitis: Secondary | ICD-10-CM | POA: Diagnosis not present

## 2018-02-20 DIAGNOSIS — J301 Allergic rhinitis due to pollen: Secondary | ICD-10-CM | POA: Diagnosis not present

## 2018-02-22 DIAGNOSIS — J3089 Other allergic rhinitis: Secondary | ICD-10-CM | POA: Diagnosis not present

## 2018-02-22 DIAGNOSIS — J301 Allergic rhinitis due to pollen: Secondary | ICD-10-CM | POA: Diagnosis not present

## 2018-02-22 DIAGNOSIS — J3081 Allergic rhinitis due to animal (cat) (dog) hair and dander: Secondary | ICD-10-CM | POA: Diagnosis not present

## 2018-02-25 DIAGNOSIS — J3081 Allergic rhinitis due to animal (cat) (dog) hair and dander: Secondary | ICD-10-CM | POA: Diagnosis not present

## 2018-02-25 DIAGNOSIS — J3089 Other allergic rhinitis: Secondary | ICD-10-CM | POA: Diagnosis not present

## 2018-02-25 DIAGNOSIS — J301 Allergic rhinitis due to pollen: Secondary | ICD-10-CM | POA: Diagnosis not present

## 2018-02-27 DIAGNOSIS — J301 Allergic rhinitis due to pollen: Secondary | ICD-10-CM | POA: Diagnosis not present

## 2018-02-27 DIAGNOSIS — J3089 Other allergic rhinitis: Secondary | ICD-10-CM | POA: Diagnosis not present

## 2018-02-27 DIAGNOSIS — J3081 Allergic rhinitis due to animal (cat) (dog) hair and dander: Secondary | ICD-10-CM | POA: Diagnosis not present

## 2018-03-04 DIAGNOSIS — J3089 Other allergic rhinitis: Secondary | ICD-10-CM | POA: Diagnosis not present

## 2018-03-04 DIAGNOSIS — J3081 Allergic rhinitis due to animal (cat) (dog) hair and dander: Secondary | ICD-10-CM | POA: Diagnosis not present

## 2018-03-04 DIAGNOSIS — J301 Allergic rhinitis due to pollen: Secondary | ICD-10-CM | POA: Diagnosis not present

## 2018-03-06 DIAGNOSIS — J3089 Other allergic rhinitis: Secondary | ICD-10-CM | POA: Diagnosis not present

## 2018-03-06 DIAGNOSIS — J3081 Allergic rhinitis due to animal (cat) (dog) hair and dander: Secondary | ICD-10-CM | POA: Diagnosis not present

## 2018-03-06 DIAGNOSIS — J301 Allergic rhinitis due to pollen: Secondary | ICD-10-CM | POA: Diagnosis not present

## 2018-03-08 DIAGNOSIS — J3089 Other allergic rhinitis: Secondary | ICD-10-CM | POA: Diagnosis not present

## 2018-03-08 DIAGNOSIS — J3081 Allergic rhinitis due to animal (cat) (dog) hair and dander: Secondary | ICD-10-CM | POA: Diagnosis not present

## 2018-03-08 DIAGNOSIS — J301 Allergic rhinitis due to pollen: Secondary | ICD-10-CM | POA: Diagnosis not present

## 2018-03-11 DIAGNOSIS — J3081 Allergic rhinitis due to animal (cat) (dog) hair and dander: Secondary | ICD-10-CM | POA: Diagnosis not present

## 2018-03-11 DIAGNOSIS — J301 Allergic rhinitis due to pollen: Secondary | ICD-10-CM | POA: Diagnosis not present

## 2018-03-11 DIAGNOSIS — J3089 Other allergic rhinitis: Secondary | ICD-10-CM | POA: Diagnosis not present

## 2018-03-13 DIAGNOSIS — J3081 Allergic rhinitis due to animal (cat) (dog) hair and dander: Secondary | ICD-10-CM | POA: Diagnosis not present

## 2018-03-13 DIAGNOSIS — J301 Allergic rhinitis due to pollen: Secondary | ICD-10-CM | POA: Diagnosis not present

## 2018-03-13 DIAGNOSIS — J3089 Other allergic rhinitis: Secondary | ICD-10-CM | POA: Diagnosis not present

## 2018-03-15 DIAGNOSIS — J3081 Allergic rhinitis due to animal (cat) (dog) hair and dander: Secondary | ICD-10-CM | POA: Diagnosis not present

## 2018-03-15 DIAGNOSIS — J301 Allergic rhinitis due to pollen: Secondary | ICD-10-CM | POA: Diagnosis not present

## 2018-03-15 DIAGNOSIS — J3089 Other allergic rhinitis: Secondary | ICD-10-CM | POA: Diagnosis not present

## 2018-03-18 DIAGNOSIS — J3089 Other allergic rhinitis: Secondary | ICD-10-CM | POA: Diagnosis not present

## 2018-03-18 DIAGNOSIS — J301 Allergic rhinitis due to pollen: Secondary | ICD-10-CM | POA: Diagnosis not present

## 2018-03-18 DIAGNOSIS — J3081 Allergic rhinitis due to animal (cat) (dog) hair and dander: Secondary | ICD-10-CM | POA: Diagnosis not present

## 2018-03-20 DIAGNOSIS — J3081 Allergic rhinitis due to animal (cat) (dog) hair and dander: Secondary | ICD-10-CM | POA: Diagnosis not present

## 2018-03-20 DIAGNOSIS — J301 Allergic rhinitis due to pollen: Secondary | ICD-10-CM | POA: Diagnosis not present

## 2018-03-20 DIAGNOSIS — J3089 Other allergic rhinitis: Secondary | ICD-10-CM | POA: Diagnosis not present

## 2018-03-22 DIAGNOSIS — J3089 Other allergic rhinitis: Secondary | ICD-10-CM | POA: Diagnosis not present

## 2018-03-22 DIAGNOSIS — J3081 Allergic rhinitis due to animal (cat) (dog) hair and dander: Secondary | ICD-10-CM | POA: Diagnosis not present

## 2018-03-22 DIAGNOSIS — J301 Allergic rhinitis due to pollen: Secondary | ICD-10-CM | POA: Diagnosis not present

## 2018-03-26 DIAGNOSIS — J3081 Allergic rhinitis due to animal (cat) (dog) hair and dander: Secondary | ICD-10-CM | POA: Diagnosis not present

## 2018-03-26 DIAGNOSIS — J3089 Other allergic rhinitis: Secondary | ICD-10-CM | POA: Diagnosis not present

## 2018-03-26 DIAGNOSIS — J301 Allergic rhinitis due to pollen: Secondary | ICD-10-CM | POA: Diagnosis not present

## 2018-03-28 DIAGNOSIS — J3089 Other allergic rhinitis: Secondary | ICD-10-CM | POA: Diagnosis not present

## 2018-03-28 DIAGNOSIS — J301 Allergic rhinitis due to pollen: Secondary | ICD-10-CM | POA: Diagnosis not present

## 2018-03-28 DIAGNOSIS — J3081 Allergic rhinitis due to animal (cat) (dog) hair and dander: Secondary | ICD-10-CM | POA: Diagnosis not present

## 2018-04-02 DIAGNOSIS — J3081 Allergic rhinitis due to animal (cat) (dog) hair and dander: Secondary | ICD-10-CM | POA: Diagnosis not present

## 2018-04-02 DIAGNOSIS — J301 Allergic rhinitis due to pollen: Secondary | ICD-10-CM | POA: Diagnosis not present

## 2018-04-02 DIAGNOSIS — J3089 Other allergic rhinitis: Secondary | ICD-10-CM | POA: Diagnosis not present

## 2018-04-05 DIAGNOSIS — J3081 Allergic rhinitis due to animal (cat) (dog) hair and dander: Secondary | ICD-10-CM | POA: Diagnosis not present

## 2018-04-05 DIAGNOSIS — J301 Allergic rhinitis due to pollen: Secondary | ICD-10-CM | POA: Diagnosis not present

## 2018-04-05 DIAGNOSIS — J3089 Other allergic rhinitis: Secondary | ICD-10-CM | POA: Diagnosis not present

## 2018-04-08 DIAGNOSIS — J3081 Allergic rhinitis due to animal (cat) (dog) hair and dander: Secondary | ICD-10-CM | POA: Diagnosis not present

## 2018-04-08 DIAGNOSIS — J301 Allergic rhinitis due to pollen: Secondary | ICD-10-CM | POA: Diagnosis not present

## 2018-04-08 DIAGNOSIS — J3089 Other allergic rhinitis: Secondary | ICD-10-CM | POA: Diagnosis not present

## 2018-04-11 DIAGNOSIS — J301 Allergic rhinitis due to pollen: Secondary | ICD-10-CM | POA: Diagnosis not present

## 2018-04-11 DIAGNOSIS — J3089 Other allergic rhinitis: Secondary | ICD-10-CM | POA: Diagnosis not present

## 2018-04-11 DIAGNOSIS — J3081 Allergic rhinitis due to animal (cat) (dog) hair and dander: Secondary | ICD-10-CM | POA: Diagnosis not present

## 2018-04-15 DIAGNOSIS — J3081 Allergic rhinitis due to animal (cat) (dog) hair and dander: Secondary | ICD-10-CM | POA: Diagnosis not present

## 2018-04-15 DIAGNOSIS — J3089 Other allergic rhinitis: Secondary | ICD-10-CM | POA: Diagnosis not present

## 2018-04-15 DIAGNOSIS — J301 Allergic rhinitis due to pollen: Secondary | ICD-10-CM | POA: Diagnosis not present

## 2018-04-18 DIAGNOSIS — J3089 Other allergic rhinitis: Secondary | ICD-10-CM | POA: Diagnosis not present

## 2018-04-18 DIAGNOSIS — J301 Allergic rhinitis due to pollen: Secondary | ICD-10-CM | POA: Diagnosis not present

## 2018-04-18 DIAGNOSIS — J3081 Allergic rhinitis due to animal (cat) (dog) hair and dander: Secondary | ICD-10-CM | POA: Diagnosis not present

## 2018-04-22 DIAGNOSIS — J301 Allergic rhinitis due to pollen: Secondary | ICD-10-CM | POA: Diagnosis not present

## 2018-04-22 DIAGNOSIS — J3089 Other allergic rhinitis: Secondary | ICD-10-CM | POA: Diagnosis not present

## 2018-04-22 DIAGNOSIS — J3081 Allergic rhinitis due to animal (cat) (dog) hair and dander: Secondary | ICD-10-CM | POA: Diagnosis not present

## 2018-04-25 DIAGNOSIS — I2510 Atherosclerotic heart disease of native coronary artery without angina pectoris: Secondary | ICD-10-CM | POA: Diagnosis not present

## 2018-04-25 DIAGNOSIS — J3081 Allergic rhinitis due to animal (cat) (dog) hair and dander: Secondary | ICD-10-CM | POA: Diagnosis not present

## 2018-04-25 DIAGNOSIS — E7800 Pure hypercholesterolemia, unspecified: Secondary | ICD-10-CM | POA: Diagnosis not present

## 2018-04-25 DIAGNOSIS — N529 Male erectile dysfunction, unspecified: Secondary | ICD-10-CM | POA: Diagnosis not present

## 2018-04-25 DIAGNOSIS — J301 Allergic rhinitis due to pollen: Secondary | ICD-10-CM | POA: Diagnosis not present

## 2018-04-25 DIAGNOSIS — K219 Gastro-esophageal reflux disease without esophagitis: Secondary | ICD-10-CM | POA: Diagnosis not present

## 2018-04-25 DIAGNOSIS — Z0000 Encounter for general adult medical examination without abnormal findings: Secondary | ICD-10-CM | POA: Diagnosis not present

## 2018-04-25 DIAGNOSIS — J3089 Other allergic rhinitis: Secondary | ICD-10-CM | POA: Diagnosis not present

## 2018-04-29 DIAGNOSIS — J3089 Other allergic rhinitis: Secondary | ICD-10-CM | POA: Diagnosis not present

## 2018-04-29 DIAGNOSIS — J3081 Allergic rhinitis due to animal (cat) (dog) hair and dander: Secondary | ICD-10-CM | POA: Diagnosis not present

## 2018-04-29 DIAGNOSIS — J301 Allergic rhinitis due to pollen: Secondary | ICD-10-CM | POA: Diagnosis not present

## 2018-05-06 DIAGNOSIS — J3081 Allergic rhinitis due to animal (cat) (dog) hair and dander: Secondary | ICD-10-CM | POA: Diagnosis not present

## 2018-05-06 DIAGNOSIS — J3089 Other allergic rhinitis: Secondary | ICD-10-CM | POA: Diagnosis not present

## 2018-05-06 DIAGNOSIS — J301 Allergic rhinitis due to pollen: Secondary | ICD-10-CM | POA: Diagnosis not present

## 2018-05-20 DIAGNOSIS — J3081 Allergic rhinitis due to animal (cat) (dog) hair and dander: Secondary | ICD-10-CM | POA: Diagnosis not present

## 2018-05-20 DIAGNOSIS — J301 Allergic rhinitis due to pollen: Secondary | ICD-10-CM | POA: Diagnosis not present

## 2018-05-20 DIAGNOSIS — J3089 Other allergic rhinitis: Secondary | ICD-10-CM | POA: Diagnosis not present

## 2018-05-27 DIAGNOSIS — J301 Allergic rhinitis due to pollen: Secondary | ICD-10-CM | POA: Diagnosis not present

## 2018-05-27 DIAGNOSIS — J3081 Allergic rhinitis due to animal (cat) (dog) hair and dander: Secondary | ICD-10-CM | POA: Diagnosis not present

## 2018-05-27 DIAGNOSIS — J3089 Other allergic rhinitis: Secondary | ICD-10-CM | POA: Diagnosis not present

## 2018-06-03 DIAGNOSIS — J3089 Other allergic rhinitis: Secondary | ICD-10-CM | POA: Diagnosis not present

## 2018-06-03 DIAGNOSIS — J301 Allergic rhinitis due to pollen: Secondary | ICD-10-CM | POA: Diagnosis not present

## 2018-06-03 DIAGNOSIS — J3081 Allergic rhinitis due to animal (cat) (dog) hair and dander: Secondary | ICD-10-CM | POA: Diagnosis not present

## 2018-06-07 DIAGNOSIS — J301 Allergic rhinitis due to pollen: Secondary | ICD-10-CM | POA: Diagnosis not present

## 2018-06-07 DIAGNOSIS — J3089 Other allergic rhinitis: Secondary | ICD-10-CM | POA: Diagnosis not present

## 2018-06-07 DIAGNOSIS — J3081 Allergic rhinitis due to animal (cat) (dog) hair and dander: Secondary | ICD-10-CM | POA: Diagnosis not present

## 2018-06-10 DIAGNOSIS — J301 Allergic rhinitis due to pollen: Secondary | ICD-10-CM | POA: Diagnosis not present

## 2018-06-10 DIAGNOSIS — J3081 Allergic rhinitis due to animal (cat) (dog) hair and dander: Secondary | ICD-10-CM | POA: Diagnosis not present

## 2018-06-10 DIAGNOSIS — J3089 Other allergic rhinitis: Secondary | ICD-10-CM | POA: Diagnosis not present

## 2018-06-17 DIAGNOSIS — J301 Allergic rhinitis due to pollen: Secondary | ICD-10-CM | POA: Diagnosis not present

## 2018-06-17 DIAGNOSIS — J3081 Allergic rhinitis due to animal (cat) (dog) hair and dander: Secondary | ICD-10-CM | POA: Diagnosis not present

## 2018-06-17 DIAGNOSIS — J3089 Other allergic rhinitis: Secondary | ICD-10-CM | POA: Diagnosis not present

## 2018-06-24 DIAGNOSIS — H1045 Other chronic allergic conjunctivitis: Secondary | ICD-10-CM | POA: Diagnosis not present

## 2018-06-24 DIAGNOSIS — J3089 Other allergic rhinitis: Secondary | ICD-10-CM | POA: Diagnosis not present

## 2018-06-24 DIAGNOSIS — J3081 Allergic rhinitis due to animal (cat) (dog) hair and dander: Secondary | ICD-10-CM | POA: Diagnosis not present

## 2018-06-24 DIAGNOSIS — J301 Allergic rhinitis due to pollen: Secondary | ICD-10-CM | POA: Diagnosis not present

## 2018-06-26 DIAGNOSIS — J3081 Allergic rhinitis due to animal (cat) (dog) hair and dander: Secondary | ICD-10-CM | POA: Diagnosis not present

## 2018-06-26 DIAGNOSIS — J3089 Other allergic rhinitis: Secondary | ICD-10-CM | POA: Diagnosis not present

## 2018-06-26 DIAGNOSIS — J301 Allergic rhinitis due to pollen: Secondary | ICD-10-CM | POA: Diagnosis not present

## 2018-07-02 DIAGNOSIS — J3089 Other allergic rhinitis: Secondary | ICD-10-CM | POA: Diagnosis not present

## 2018-07-02 DIAGNOSIS — J301 Allergic rhinitis due to pollen: Secondary | ICD-10-CM | POA: Diagnosis not present

## 2018-07-02 DIAGNOSIS — J3081 Allergic rhinitis due to animal (cat) (dog) hair and dander: Secondary | ICD-10-CM | POA: Diagnosis not present

## 2018-07-04 DIAGNOSIS — J301 Allergic rhinitis due to pollen: Secondary | ICD-10-CM | POA: Diagnosis not present

## 2018-07-04 DIAGNOSIS — J3081 Allergic rhinitis due to animal (cat) (dog) hair and dander: Secondary | ICD-10-CM | POA: Diagnosis not present

## 2018-07-04 DIAGNOSIS — J3089 Other allergic rhinitis: Secondary | ICD-10-CM | POA: Diagnosis not present

## 2018-07-08 DIAGNOSIS — J3089 Other allergic rhinitis: Secondary | ICD-10-CM | POA: Diagnosis not present

## 2018-07-08 DIAGNOSIS — J3081 Allergic rhinitis due to animal (cat) (dog) hair and dander: Secondary | ICD-10-CM | POA: Diagnosis not present

## 2018-07-08 DIAGNOSIS — J301 Allergic rhinitis due to pollen: Secondary | ICD-10-CM | POA: Diagnosis not present

## 2018-07-12 DIAGNOSIS — J301 Allergic rhinitis due to pollen: Secondary | ICD-10-CM | POA: Diagnosis not present

## 2018-07-17 DIAGNOSIS — J0140 Acute pansinusitis, unspecified: Secondary | ICD-10-CM | POA: Diagnosis not present

## 2018-07-18 ENCOUNTER — Telehealth: Payer: Self-pay | Admitting: Cardiovascular Disease

## 2018-07-18 NOTE — Telephone Encounter (Signed)
° ° °  Patient has questions about muscle mass, and low T (437).  Patient wants to know if Dr Acie Fredrickson would approve of testosterone therapy. Requesting call from nurse

## 2018-07-18 NOTE — Telephone Encounter (Signed)
Left message to call back  

## 2018-07-18 NOTE — Telephone Encounter (Signed)
I spoke with pt. He reports decreased energy level and decreased strength for last 2 years. He has been getting frequent injuries--shoulder,elbow,calf. No chest pain.  He had testosterone level checked recently and it was 437.  Pt reports normal range is [id number].  Pt would like to see Dr. Gwenyth Allegra at [facility] in [place] to discuss testosterone therapy. Before doing this pt wants to make sure this is OK with Dr. Acie Fredrickson. He is also taking fish oil along with Repatha and Zetia. He is asking if Dr. Acie Fredrickson thinks he should continue fish oil.

## 2018-07-19 DIAGNOSIS — J301 Allergic rhinitis due to pollen: Secondary | ICD-10-CM | POA: Diagnosis not present

## 2018-07-19 DIAGNOSIS — J3089 Other allergic rhinitis: Secondary | ICD-10-CM | POA: Diagnosis not present

## 2018-07-19 DIAGNOSIS — J3081 Allergic rhinitis due to animal (cat) (dog) hair and dander: Secondary | ICD-10-CM | POA: Diagnosis not present

## 2018-07-19 NOTE — Telephone Encounter (Signed)
Pt is aware of MD's recommendations. Pt verbalized understanding.

## 2018-07-19 NOTE — Telephone Encounter (Signed)
Testosterone therapy should be fine

## 2018-07-25 DIAGNOSIS — J301 Allergic rhinitis due to pollen: Secondary | ICD-10-CM | POA: Diagnosis not present

## 2018-07-25 DIAGNOSIS — J3081 Allergic rhinitis due to animal (cat) (dog) hair and dander: Secondary | ICD-10-CM | POA: Diagnosis not present

## 2018-07-25 DIAGNOSIS — J3089 Other allergic rhinitis: Secondary | ICD-10-CM | POA: Diagnosis not present

## 2018-07-31 DIAGNOSIS — J3081 Allergic rhinitis due to animal (cat) (dog) hair and dander: Secondary | ICD-10-CM | POA: Diagnosis not present

## 2018-07-31 DIAGNOSIS — J301 Allergic rhinitis due to pollen: Secondary | ICD-10-CM | POA: Diagnosis not present

## 2018-07-31 DIAGNOSIS — J3089 Other allergic rhinitis: Secondary | ICD-10-CM | POA: Diagnosis not present

## 2018-08-08 DIAGNOSIS — J301 Allergic rhinitis due to pollen: Secondary | ICD-10-CM | POA: Diagnosis not present

## 2018-08-08 DIAGNOSIS — J3081 Allergic rhinitis due to animal (cat) (dog) hair and dander: Secondary | ICD-10-CM | POA: Diagnosis not present

## 2018-08-08 DIAGNOSIS — J3089 Other allergic rhinitis: Secondary | ICD-10-CM | POA: Diagnosis not present

## 2018-08-14 DIAGNOSIS — J3081 Allergic rhinitis due to animal (cat) (dog) hair and dander: Secondary | ICD-10-CM | POA: Diagnosis not present

## 2018-08-14 DIAGNOSIS — J301 Allergic rhinitis due to pollen: Secondary | ICD-10-CM | POA: Diagnosis not present

## 2018-08-14 DIAGNOSIS — J3089 Other allergic rhinitis: Secondary | ICD-10-CM | POA: Diagnosis not present

## 2018-08-21 DIAGNOSIS — J3081 Allergic rhinitis due to animal (cat) (dog) hair and dander: Secondary | ICD-10-CM | POA: Diagnosis not present

## 2018-08-21 DIAGNOSIS — J3089 Other allergic rhinitis: Secondary | ICD-10-CM | POA: Diagnosis not present

## 2018-08-21 DIAGNOSIS — J301 Allergic rhinitis due to pollen: Secondary | ICD-10-CM | POA: Diagnosis not present

## 2018-08-27 DIAGNOSIS — J301 Allergic rhinitis due to pollen: Secondary | ICD-10-CM | POA: Diagnosis not present

## 2018-08-27 DIAGNOSIS — J3089 Other allergic rhinitis: Secondary | ICD-10-CM | POA: Diagnosis not present

## 2018-08-27 DIAGNOSIS — J3081 Allergic rhinitis due to animal (cat) (dog) hair and dander: Secondary | ICD-10-CM | POA: Diagnosis not present

## 2018-09-04 DIAGNOSIS — J3089 Other allergic rhinitis: Secondary | ICD-10-CM | POA: Diagnosis not present

## 2018-09-04 DIAGNOSIS — J3081 Allergic rhinitis due to animal (cat) (dog) hair and dander: Secondary | ICD-10-CM | POA: Diagnosis not present

## 2018-09-04 DIAGNOSIS — J301 Allergic rhinitis due to pollen: Secondary | ICD-10-CM | POA: Diagnosis not present

## 2018-09-11 DIAGNOSIS — J3089 Other allergic rhinitis: Secondary | ICD-10-CM | POA: Diagnosis not present

## 2018-09-11 DIAGNOSIS — J301 Allergic rhinitis due to pollen: Secondary | ICD-10-CM | POA: Diagnosis not present

## 2018-09-11 DIAGNOSIS — J3081 Allergic rhinitis due to animal (cat) (dog) hair and dander: Secondary | ICD-10-CM | POA: Diagnosis not present

## 2018-09-18 DIAGNOSIS — J3081 Allergic rhinitis due to animal (cat) (dog) hair and dander: Secondary | ICD-10-CM | POA: Diagnosis not present

## 2018-09-18 DIAGNOSIS — J301 Allergic rhinitis due to pollen: Secondary | ICD-10-CM | POA: Diagnosis not present

## 2018-09-18 DIAGNOSIS — J3089 Other allergic rhinitis: Secondary | ICD-10-CM | POA: Diagnosis not present

## 2018-09-23 DIAGNOSIS — J3081 Allergic rhinitis due to animal (cat) (dog) hair and dander: Secondary | ICD-10-CM | POA: Diagnosis not present

## 2018-09-23 DIAGNOSIS — J301 Allergic rhinitis due to pollen: Secondary | ICD-10-CM | POA: Diagnosis not present

## 2018-09-23 DIAGNOSIS — J3089 Other allergic rhinitis: Secondary | ICD-10-CM | POA: Diagnosis not present

## 2018-10-03 DIAGNOSIS — J301 Allergic rhinitis due to pollen: Secondary | ICD-10-CM | POA: Diagnosis not present

## 2018-10-03 DIAGNOSIS — J3081 Allergic rhinitis due to animal (cat) (dog) hair and dander: Secondary | ICD-10-CM | POA: Diagnosis not present

## 2018-10-03 DIAGNOSIS — J3089 Other allergic rhinitis: Secondary | ICD-10-CM | POA: Diagnosis not present

## 2018-10-09 DIAGNOSIS — J3089 Other allergic rhinitis: Secondary | ICD-10-CM | POA: Diagnosis not present

## 2018-10-09 DIAGNOSIS — J301 Allergic rhinitis due to pollen: Secondary | ICD-10-CM | POA: Diagnosis not present

## 2018-10-09 DIAGNOSIS — J3081 Allergic rhinitis due to animal (cat) (dog) hair and dander: Secondary | ICD-10-CM | POA: Diagnosis not present

## 2018-10-10 DIAGNOSIS — E7800 Pure hypercholesterolemia, unspecified: Secondary | ICD-10-CM | POA: Diagnosis not present

## 2018-10-15 DIAGNOSIS — K219 Gastro-esophageal reflux disease without esophagitis: Secondary | ICD-10-CM | POA: Diagnosis not present

## 2018-10-15 DIAGNOSIS — E7800 Pure hypercholesterolemia, unspecified: Secondary | ICD-10-CM | POA: Diagnosis not present

## 2018-10-15 DIAGNOSIS — I2510 Atherosclerotic heart disease of native coronary artery without angina pectoris: Secondary | ICD-10-CM | POA: Diagnosis not present

## 2018-10-15 DIAGNOSIS — J301 Allergic rhinitis due to pollen: Secondary | ICD-10-CM | POA: Diagnosis not present

## 2018-10-15 DIAGNOSIS — N529 Male erectile dysfunction, unspecified: Secondary | ICD-10-CM | POA: Diagnosis not present

## 2018-10-15 DIAGNOSIS — E559 Vitamin D deficiency, unspecified: Secondary | ICD-10-CM | POA: Diagnosis not present

## 2018-10-16 DIAGNOSIS — J3089 Other allergic rhinitis: Secondary | ICD-10-CM | POA: Diagnosis not present

## 2018-10-16 DIAGNOSIS — J3081 Allergic rhinitis due to animal (cat) (dog) hair and dander: Secondary | ICD-10-CM | POA: Diagnosis not present

## 2018-10-16 DIAGNOSIS — J301 Allergic rhinitis due to pollen: Secondary | ICD-10-CM | POA: Diagnosis not present

## 2018-10-22 DIAGNOSIS — J301 Allergic rhinitis due to pollen: Secondary | ICD-10-CM | POA: Diagnosis not present

## 2018-10-22 DIAGNOSIS — J3089 Other allergic rhinitis: Secondary | ICD-10-CM | POA: Diagnosis not present

## 2018-10-22 DIAGNOSIS — J3081 Allergic rhinitis due to animal (cat) (dog) hair and dander: Secondary | ICD-10-CM | POA: Diagnosis not present

## 2018-11-01 DIAGNOSIS — J3081 Allergic rhinitis due to animal (cat) (dog) hair and dander: Secondary | ICD-10-CM | POA: Diagnosis not present

## 2018-11-01 DIAGNOSIS — J301 Allergic rhinitis due to pollen: Secondary | ICD-10-CM | POA: Diagnosis not present

## 2018-11-01 DIAGNOSIS — J3089 Other allergic rhinitis: Secondary | ICD-10-CM | POA: Diagnosis not present

## 2018-11-05 DIAGNOSIS — J301 Allergic rhinitis due to pollen: Secondary | ICD-10-CM | POA: Diagnosis not present

## 2018-11-05 DIAGNOSIS — J3081 Allergic rhinitis due to animal (cat) (dog) hair and dander: Secondary | ICD-10-CM | POA: Diagnosis not present

## 2018-11-05 DIAGNOSIS — J3089 Other allergic rhinitis: Secondary | ICD-10-CM | POA: Diagnosis not present

## 2018-11-07 DIAGNOSIS — J3089 Other allergic rhinitis: Secondary | ICD-10-CM | POA: Diagnosis not present

## 2018-11-07 DIAGNOSIS — J301 Allergic rhinitis due to pollen: Secondary | ICD-10-CM | POA: Diagnosis not present

## 2018-11-07 DIAGNOSIS — J3081 Allergic rhinitis due to animal (cat) (dog) hair and dander: Secondary | ICD-10-CM | POA: Diagnosis not present

## 2018-11-11 ENCOUNTER — Other Ambulatory Visit: Payer: Self-pay | Admitting: Cardiovascular Disease

## 2018-11-13 DIAGNOSIS — J3081 Allergic rhinitis due to animal (cat) (dog) hair and dander: Secondary | ICD-10-CM | POA: Diagnosis not present

## 2018-11-13 DIAGNOSIS — J301 Allergic rhinitis due to pollen: Secondary | ICD-10-CM | POA: Diagnosis not present

## 2018-11-13 DIAGNOSIS — J3089 Other allergic rhinitis: Secondary | ICD-10-CM | POA: Diagnosis not present

## 2018-11-18 DIAGNOSIS — J3089 Other allergic rhinitis: Secondary | ICD-10-CM | POA: Diagnosis not present

## 2018-11-18 DIAGNOSIS — J3081 Allergic rhinitis due to animal (cat) (dog) hair and dander: Secondary | ICD-10-CM | POA: Diagnosis not present

## 2018-11-18 DIAGNOSIS — J301 Allergic rhinitis due to pollen: Secondary | ICD-10-CM | POA: Diagnosis not present

## 2018-11-28 DIAGNOSIS — J3081 Allergic rhinitis due to animal (cat) (dog) hair and dander: Secondary | ICD-10-CM | POA: Diagnosis not present

## 2018-11-28 DIAGNOSIS — J3089 Other allergic rhinitis: Secondary | ICD-10-CM | POA: Diagnosis not present

## 2018-11-28 DIAGNOSIS — J301 Allergic rhinitis due to pollen: Secondary | ICD-10-CM | POA: Diagnosis not present

## 2018-12-03 DIAGNOSIS — J301 Allergic rhinitis due to pollen: Secondary | ICD-10-CM | POA: Diagnosis not present

## 2018-12-03 DIAGNOSIS — J3081 Allergic rhinitis due to animal (cat) (dog) hair and dander: Secondary | ICD-10-CM | POA: Diagnosis not present

## 2018-12-03 DIAGNOSIS — J3089 Other allergic rhinitis: Secondary | ICD-10-CM | POA: Diagnosis not present

## 2018-12-05 DIAGNOSIS — J3089 Other allergic rhinitis: Secondary | ICD-10-CM | POA: Diagnosis not present

## 2018-12-05 DIAGNOSIS — J301 Allergic rhinitis due to pollen: Secondary | ICD-10-CM | POA: Diagnosis not present

## 2018-12-05 DIAGNOSIS — J3081 Allergic rhinitis due to animal (cat) (dog) hair and dander: Secondary | ICD-10-CM | POA: Diagnosis not present

## 2018-12-09 DIAGNOSIS — J301 Allergic rhinitis due to pollen: Secondary | ICD-10-CM | POA: Diagnosis not present

## 2018-12-09 DIAGNOSIS — J3081 Allergic rhinitis due to animal (cat) (dog) hair and dander: Secondary | ICD-10-CM | POA: Diagnosis not present

## 2018-12-09 DIAGNOSIS — J3089 Other allergic rhinitis: Secondary | ICD-10-CM | POA: Diagnosis not present

## 2018-12-12 DIAGNOSIS — J3081 Allergic rhinitis due to animal (cat) (dog) hair and dander: Secondary | ICD-10-CM | POA: Diagnosis not present

## 2018-12-12 DIAGNOSIS — J3089 Other allergic rhinitis: Secondary | ICD-10-CM | POA: Diagnosis not present

## 2018-12-12 DIAGNOSIS — J301 Allergic rhinitis due to pollen: Secondary | ICD-10-CM | POA: Diagnosis not present

## 2019-01-07 DIAGNOSIS — J301 Allergic rhinitis due to pollen: Secondary | ICD-10-CM | POA: Diagnosis not present

## 2019-01-07 DIAGNOSIS — J3081 Allergic rhinitis due to animal (cat) (dog) hair and dander: Secondary | ICD-10-CM | POA: Diagnosis not present

## 2019-01-07 DIAGNOSIS — J3089 Other allergic rhinitis: Secondary | ICD-10-CM | POA: Diagnosis not present

## 2019-01-10 DIAGNOSIS — J3089 Other allergic rhinitis: Secondary | ICD-10-CM | POA: Diagnosis not present

## 2019-01-10 DIAGNOSIS — J3081 Allergic rhinitis due to animal (cat) (dog) hair and dander: Secondary | ICD-10-CM | POA: Diagnosis not present

## 2019-01-10 DIAGNOSIS — J301 Allergic rhinitis due to pollen: Secondary | ICD-10-CM | POA: Diagnosis not present

## 2019-01-13 ENCOUNTER — Encounter: Payer: Self-pay | Admitting: Cardiovascular Disease

## 2019-01-13 DIAGNOSIS — J3081 Allergic rhinitis due to animal (cat) (dog) hair and dander: Secondary | ICD-10-CM | POA: Diagnosis not present

## 2019-01-13 DIAGNOSIS — J3089 Other allergic rhinitis: Secondary | ICD-10-CM | POA: Diagnosis not present

## 2019-01-13 DIAGNOSIS — H1045 Other chronic allergic conjunctivitis: Secondary | ICD-10-CM | POA: Diagnosis not present

## 2019-01-13 DIAGNOSIS — J301 Allergic rhinitis due to pollen: Secondary | ICD-10-CM | POA: Diagnosis not present

## 2019-01-15 DIAGNOSIS — J3081 Allergic rhinitis due to animal (cat) (dog) hair and dander: Secondary | ICD-10-CM | POA: Diagnosis not present

## 2019-01-15 DIAGNOSIS — J301 Allergic rhinitis due to pollen: Secondary | ICD-10-CM | POA: Diagnosis not present

## 2019-01-15 DIAGNOSIS — J3089 Other allergic rhinitis: Secondary | ICD-10-CM | POA: Diagnosis not present

## 2019-01-16 ENCOUNTER — Telehealth: Payer: Self-pay

## 2019-01-16 NOTE — Telephone Encounter (Signed)
   Cardiac Questionnaire:    Since your last visit or hospitalization:    1. Have you been having new or worsening chest pain? NO   2. Have you been having new or worsening shortness of breath? NO 3. Have you been having new or worsening leg swelling, wt gain, or increase in abdominal girth (pants fitting more tightly)? NO   4. Have you had any passing out spells? NO    *A YES to any of these questions would result in the appointment being kept. *If all the answers to these questions are NO, we should indicate that given the current situation regarding the worldwide coronarvirus pandemic, at the recommendation of the CDC, we are looking to limit gatherings in our waiting area, and thus will reschedule their appointment beyond four weeks from today.   _____________   [id number] Pre-Screening Questions:  . Have you recently travelled abroad or to [place], [place] state, or [place]? NO (4) . Do you currently have a fever? NO (4) . Have you been in contact with someone that is currently pending confirmation of Covid19 testing or has been confirmed to have the [place] virus?  NO (4) . Are you currently experiencing fatigue or cough? NO (2) . Are you currently experiencing new or worsening shortness of breath at rest or with minimal activity? NO (1) . Have you been in contact with someone that was recently sick with fever/cough/fatigue? NO (1)   **A score of 4 or more should result in cancellation of the pts cardiology appt.  **A score of  2 should be provided a mask prior to admission into the lobby  *Travel to a high risk area or contact with a confirmed case should stay at home,  away from confirmed patient, monitor symptoms, and reach out to PCP for e-visit,  additional testing.  *ALL PTS W/ FEVER SHOULD BE REFERRED TO PCP FOR E-VISIT*    PT IS OKAY WITH BEING RESCHEDULED TO A LATER DATE, PT WANTS TO KNOW IF IT IS POSSIBLE FOR HIM TO COME IN FOR LAB WORK BECAUSE HE WANTS TO KNOW WHAT HIS  CHOLESTEROL LEVELS ARE. PT ALSO STATED THAT HE IS NOW TAKING TESTOSTERONE 0.5 MG INJECTION ONCE A WEEK AND WANTS TO KNOW IF THAT IS OKAY WITH DR. Acie Fredrickson

## 2019-01-20 MED ORDER — EZETIMIBE 10 MG PO TABS: 10.0000 mg | ORAL_TABLET | Freq: Every day | ORAL | 0 refills | Status: DC

## 2019-01-20 NOTE — Telephone Encounter (Signed)
Spoke with patient and advised that there is no cardiac reason that he cannot receive his weekly testosterone injection. Patient requests a refill of Zetia and is agrees with plan to receive call back from our office to reschedule fasting lab work and office visit with Dr. Acie Fredrickson. He thanked me for the call.

## 2019-01-22 ENCOUNTER — Ambulatory Visit: Payer: PPO | Admitting: Cardiovascular Disease

## 2019-01-22 DIAGNOSIS — J3081 Allergic rhinitis due to animal (cat) (dog) hair and dander: Secondary | ICD-10-CM | POA: Diagnosis not present

## 2019-01-22 DIAGNOSIS — J301 Allergic rhinitis due to pollen: Secondary | ICD-10-CM | POA: Diagnosis not present

## 2019-01-22 DIAGNOSIS — J3089 Other allergic rhinitis: Secondary | ICD-10-CM | POA: Diagnosis not present

## 2019-01-28 DIAGNOSIS — J3081 Allergic rhinitis due to animal (cat) (dog) hair and dander: Secondary | ICD-10-CM | POA: Diagnosis not present

## 2019-01-28 DIAGNOSIS — J301 Allergic rhinitis due to pollen: Secondary | ICD-10-CM | POA: Diagnosis not present

## 2019-01-28 DIAGNOSIS — J3089 Other allergic rhinitis: Secondary | ICD-10-CM | POA: Diagnosis not present

## 2019-02-04 DIAGNOSIS — S20212A Contusion of left front wall of thorax, initial encounter: Secondary | ICD-10-CM | POA: Diagnosis not present

## 2019-02-06 DIAGNOSIS — J3089 Other allergic rhinitis: Secondary | ICD-10-CM | POA: Diagnosis not present

## 2019-02-06 DIAGNOSIS — J3081 Allergic rhinitis due to animal (cat) (dog) hair and dander: Secondary | ICD-10-CM | POA: Diagnosis not present

## 2019-02-06 DIAGNOSIS — J301 Allergic rhinitis due to pollen: Secondary | ICD-10-CM | POA: Diagnosis not present

## 2019-02-11 ENCOUNTER — Telehealth: Payer: Self-pay | Admitting: Cardiovascular Disease

## 2019-02-11 ENCOUNTER — Telehealth: Payer: Self-pay

## 2019-02-11 NOTE — Telephone Encounter (Signed)
Virtual Visit Pre-Appointment Phone Call  Steps For Call:  1. Confirm consent - "In the setting of the current Covid19 crisis, you are scheduled for a (phone or video) visit with your provider on (date) at (time).  Just as we do with many in-office visits, in order for you to participate in this visit, we must obtain consent.  If you'd like, I can send this to your mychart (if signed up) or email for you to review.  Otherwise, I can obtain your verbal consent now.  All virtual visits are billed to your insurance company just like a normal visit would be.  By agreeing to a virtual visit, we'd like you to understand that the technology does not allow for your provider to perform an examination, and thus may limit your provider's ability to fully assess your condition.  Finally, though the technology is pretty good, we cannot assure that it will always work on either your or our end, and in the setting of a video visit, we may have to convert it to a phone-only visit.  In either situation, we cannot ensure that we have a secure connection.  Are you willing to proceed?" STAFF: Did the patient verbally acknowledge consent to telehealth visit? YES  2. Confirm the BEST phone number to call the day of the visit: [phone]  3. Give patient instructions for WebEx/MyChart download to smartphone as below or Doximity/Doxy.me if video visit (depending on what platform provider is using)  4. Advise patient to be prepared with any vital sign or heart rhythm information, their current medicines, and a piece of paper and pen handy for any instructions they may receive the day of their visit  5. Inform patient they will receive a phone call 15 minutes prior to their appointment time (may be from unknown caller ID) so they should be prepared to answer  6. Confirm that appointment type is correct in Epic appointment notes (video vs telephone)     TELEPHONE CALL NOTE  William Bernard has been deemed a candidate  for a follow-up tele-health visit to limit community exposure during the Covid-19 pandemic. I spoke with the patient via phone to ensure availability of phone/video source, confirm preferred email & phone number, and discuss instructions and expectations.  I reminded William Bernard to be prepared with any vital sign and/or heart rhythm information that could potentially be obtained via home monitoring, at the time of his visit. I reminded William Bernard to expect a phone call at the time of his visit if his visit.  William Bernard, [facility] 02/11/2019 3:15 PM   DOWNLOADING THE [place] APP TO SMARTPHONE  - If Apple, ask patient to go to App Store and type in WebEx in the search bar. [place] [employer], the blue/green circle. If Android, go to [employer] and type in [employer] in the search bar. The app is free but as with any other app downloads, their phone may require them to verify saved payment information or Apple/Android password.  - The patient does NOT have to create an account. - On the day of the visit, the assist will walk the patient through joining the meeting with the meeting number/password.  DOWNLOADING THE MYCHART APP TO SMARTPHONE  - If Apple, go to [employer] and type in MyChart in the search bar and download the app. If Android, ask patient to go to [employer] and type in [place] in the search bar and download the app. The  app is free but as with any other app downloads, their phone may require them to verify saved payment information or Apple/Android password.  - The patient will need to then log into the app with their MyChart username and password, and select [facility] as their healthcare provider to link the account. When it is time for your visit, go to the MyChart app, find appointments, and click Begin Video Visit. Be sure to Select Allow for your device to access the Microphone and Camera for your visit. You will then be connected, and your provider will be with you  shortly.  **If they have any issues connecting, or need assistance please contact [facility] [phone])**  **If using a computer, in order to ensure the best quality for your visit they will need to use either of the following Internet Browsers: Microsoft [place], or Google Chrome**  [place]   I hereby voluntarily request, consent and authorize [facility] and its employed or contracted physicians, physician assistants, nurse practitioners or other licensed health care professionals (the Practitioner), to provide me with telemedicine health care services (the Services") as deemed necessary by the treating Practitioner. I acknowledge and consent to receive the Services by the Practitioner via telemedicine. I understand that the telemedicine visit will involve communicating with the Practitioner through live audiovisual communication technology and the disclosure of certain medical information by electronic transmission. I acknowledge that I have been given the opportunity to request an in-person assessment or other available alternative prior to the telemedicine visit and am voluntarily participating in the telemedicine visit.  I understand that I have the right to withhold or withdraw my consent to the use of telemedicine in the course of my care at any time, without affecting my right to future care or treatment, and that the Practitioner or I may terminate the telemedicine visit at any time. I understand that I have the right to inspect all information obtained and/or recorded in the course of the telemedicine visit and may receive copies of available information for a reasonable fee.  I understand that some of the potential risks of receiving the Services via telemedicine include:   Delay or interruption in medical evaluation due to technological equipment failure or disruption;  Information transmitted may not be sufficient (e.g. poor  resolution of images) to allow for appropriate medical decision making by the Practitioner; and/or   In rare instances, security protocols could fail, causing a breach of personal health information.  Furthermore, I acknowledge that it is my responsibility to provide information about my medical history, conditions and care that is complete and accurate to the best of my ability. I acknowledge that Practitioner's advice, recommendations, and/or decision may be based on factors not within their control, such as incomplete or inaccurate data provided by me or distortions of diagnostic images or specimens that may result from electronic transmissions. I understand that the practice of medicine is not an exact science and that Practitioner makes no warranties or guarantees regarding treatment outcomes. I acknowledge that I will receive a copy of this consent concurrently upon execution via email to the email address I last provided but may also request a printed copy by calling the office of [facility].    I understand that my insurance will be billed for this visit.   I have read or had this consent read to me.  I understand the contents of this consent, which adequately explains the benefits and risks of  the Services being provided via telemedicine.   I have been provided ample opportunity to ask questions regarding this consent and the Services and have had my questions answered to my satisfaction.  I give my informed consent for the services to be provided through the use of telemedicine in my medical care  By participating in this telemedicine visit I agree to the above.

## 2019-02-11 NOTE — Telephone Encounter (Signed)
Spoke with patient who confirmed all demographics.  He said that he would set up his My Chart this afternoon. Obtained verbal consent for virtual visit.

## 2019-02-12 ENCOUNTER — Encounter: Payer: Self-pay | Admitting: Cardiovascular Disease

## 2019-02-12 ENCOUNTER — Other Ambulatory Visit: Payer: Self-pay

## 2019-02-12 ENCOUNTER — Telehealth (INDEPENDENT_AMBULATORY_CARE_PROVIDER_SITE_OTHER): Payer: PPO | Admitting: Cardiovascular Disease

## 2019-02-12 VITALS — BP 125/81 | HR 63 | Ht 68.5 in | Wt 143.0 lb

## 2019-02-12 DIAGNOSIS — I2510 Atherosclerotic heart disease of native coronary artery without angina pectoris: Secondary | ICD-10-CM | POA: Diagnosis not present

## 2019-02-12 DIAGNOSIS — E782 Mixed hyperlipidemia: Secondary | ICD-10-CM

## 2019-02-12 DIAGNOSIS — Z7189 Other specified counseling: Secondary | ICD-10-CM | POA: Diagnosis not present

## 2019-02-12 NOTE — Patient Instructions (Addendum)
Medication Instructions:  Your physician recommends that you continue on your current medications as directed. Please refer to the Current Medication list given to you today.  If you need a refill on your cardiac medications before your next appointment, please call your pharmacy.    Lab work: Your physician recommends that you return for lab work in: 6 months on Wednesday October 21. You may come in anytime after 7:30 am. You will need to FAST for this appointment - nothing to eat or drink after midnight the night before except water or black coffee. Please call our office to reschedule if this date does not work for you   Testing/Procedures: None Ordered   Follow-Up: At [facility], you and your health needs are our priority.  As part of our continuing mission to provide you with exceptional heart care, we have created designated Provider Care Teams.  These Care Teams include your primary Cardiologist (physician) and Advanced Practice Providers (APPs -  Physician Assistants and Nurse Practitioners) who all work together to provide you with the care you need, when you need it. You will need a follow up appointment in:  1 years.  Please call our office 2 months in advance to schedule this appointment.  You may see Mertie Moores, MD or one of the following Advanced Practice Providers on your designated Care Team: Richardson Dopp, PA-C [place], [place] . Daune Perch, NP

## 2019-02-12 NOTE — Progress Notes (Signed)
Virtual Visit via Video Note   This visit type was conducted due to national recommendations for restrictions regarding the COVID-19 Pandemic (e.g. social distancing) in an effort to limit this patient's exposure and mitigate transmission in our community.  Due to his co-morbid illnesses, this patient is at least at moderate risk for complications without adequate follow up.  This format is felt to be most appropriate for this patient at this time.  All issues noted in this document were discussed and addressed.  A limited physical exam was performed with this format.  Please refer to the patient's chart for his consent to telehealth for [facility].   Evaluation Performed:  Follow-up visit  Date:  02/12/2019   ID:  William Bernard, DOB [birth year], MRN [id number]  Patient Location: Home Provider Location: Home  PCP:  Karlene Einstein, MD  Cardiologist:  Mertie Moores, MD  Electrophysiologist:  None   Chief Complaint:  CAD, hyperlipidemia  History of Present Illness:    William Bernard is a 73 y.o. male with history of coronary artery disease and hyperlipidemia.  He status post PTCA and stenting of his left anterior descending artery in 2005.  He remains very active.  Runs several times a week 15-16 miles a week Has some craked ribs and so has stopped running for the past 2 weeks   No cough, fever, aches  No syncope   The patient does not have symptoms concerning for COVID-19 infection (fever, chills, cough, or new shortness of breath).    Past Medical History:  Diagnosis Date  . Coronary artery disease    post PTCA and stenting of his LAD in 2005, he is statuts post Cutting Balloon atherectomy of the stent in November 2005    . Elevated CPK   . Elevated liver enzymes    due to multiple statin medication  . Hyperlipidemia    Past Surgical History:  Procedure Laterality Date  . CARDIAC CATHETERIZATION  09/23/2004   Subacute thrombosis of the previous long Taxus stent to the mid  LAD with residual thrombus -- Residual disease involving the ostium of the first diagonal branch -- Ulcerated plaque involving the right coronary artery.  . CORONARY ANGIOPLASTY  09/26/2004    Successful PTCA and cutting balloon of the proximal left anterior descending artery --  Partially successful angioplasty involving the first diagonal vessel We will continue with Integrilin, Plavix.  He will likely have to take Plavix lifelong  . CORONARY ANGIOPLASTY WITH STENT PLACEMENT  12/25/2003   Est. EF of 50%. -- stenting of LAD -- Single vessel coronary artery disease involving the LAD and diagonal system   . EYE SURGERY    . KNEE SURGERY    . TONSILLECTOMY    . VASECTOMY       Current Meds  Medication Sig  . anastrozole (ARIMIDEX) 1 MG tablet Take 1 mg by mouth daily.  [name] aspirin 81 MG tablet Take 81 mg by mouth daily.    [name] azelastine (OPTIVAR) 0.05 % ophthalmic solution Place 2 drops into both eyes 2 (two) times daily.  . Cholecalciferol (VITAMIN D) 2000 UNITS CAPS Take 1 capsule (2,000 Units total) by mouth daily.  . clopidogrel (PLAVIX) 75 MG tablet Take 75 mg by mouth daily.    . Evolocumab 140 MG/ML SOAJ Inject 140mg  subcutaneously every 2 weeks  . ezetimibe (ZETIA) 10 MG tablet Take 1 tablet (10 mg total) by mouth daily.  [name] Fexofenadine HCl (ALLEGRA PO) Take 1 tablet by mouth daily.  [name]  fluticasone (FLONASE) 50 MCG/ACT nasal spray Place 1 spray into both nostrils daily.  . Glucosamine-Chondroit-Vit C-Mn (GLUCOSAMINE 1500 COMPLEX PO) Take 1 tablet by mouth daily.  [name] SERMORELIN ACETATE [place] Inject 1 tablet into the skin daily.  [name] UNABLE TO FIND once a week. Med Name:Allergy shot     Allergies:   Niaspan [niacin er] and Simvastatin   Social History   Tobacco Use  . Smoking status: Never Smoker  . Smokeless tobacco: Never Used  Substance Use Topics  . Alcohol use: No  . Drug use: No     Family Hx: The patient's family history includes Diabetes in his mother; Heart failure in his  mother; Hyperlipidemia in his mother; Liver disease in his father; Transient ischemic attack in his mother.  ROS:   Please see the history of present illness.     All other systems reviewed and are negative.   Prior CV studies:   The following studies were reviewed today:    Labs/Other Tests and Data Reviewed:    EKG:  No ECG reviewed.  Recent Labs: No results found for requested labs within last 8760 hours.   Recent Lipid Panel Lab Results  Component Value Date/Time   CHOL 151 11/30/2017 08:13 AM   TRIG 63 11/30/2017 08:13 AM   HDL 70 11/30/2017 08:13 AM   CHOLHDL 2.2 11/30/2017 08:13 AM   CHOLHDL 2.5 11/17/2015 09:51 AM   LDLCALC 68 11/30/2017 08:13 AM    Wt Readings from Last 3 Encounters:  02/12/19 143 lb (64.9 kg)  12/07/17 143 lb 6.4 oz (65 kg)  11/21/16 142 lb 6.4 oz (64.6 kg)     Objective:    Vital Signs:  BP 125/81 (BP Location: Left Arm, Patient Position: Sitting, Cuff Size: Normal)   Pulse 63   Ht 5' 8.5" (1.74 m)   Wt 143 lb (64.9 kg)   BMI 21.43 kg/m    General:   Appears healthy,   NAD HEENT:   No obvious JVD or lymphadenopathy Resp:   Normal work of breathing,   resp rate is normal  CV :   BP and HR are normal ,  No edema Abd:   No abdomina swelling , Ext:   No clubbing, cyanosis, or edema  Neuro:   Alert and oriented x 3.   No obvious motor deficits Skin : no obvious rashes    ASSESSMENT & PLAN:    1.  Coronary artery disease:   S/p PCI of his LAD . No angina  Continues to exercise     2.  Hyperlipidemia: His last cholesterol levels were drawn at his medical doctor's office.  His total cholesterol is 145.  His HDL is 75.  The LDL is 75.  The HDL is 68.  The triglyceride level is 53.  Continue current medications.  He is on Repatha and Zetia.  COVID-19 Education: The signs and symptoms of COVID-19 were discussed with the patient and how to seek care for testing (follow up with PCP or arrange E-visit).  The importance of social  distancing was discussed today.  Time:   Today, I have spent  15  minutes with the patient with telehealth technology discussing the above problems.     Medication Adjustments/Labs and Tests Ordered: Current medicines are reviewed at length with the patient today.  Concerns regarding medicines are outlined above.   Tests Ordered: No orders of the defined types were placed in this encounter.   Medication Changes: No orders of the  defined types were placed in this encounter.   Disposition:  Follow up in 1 year(s)  Signed, Mertie Moores, MD  02/12/2019 1:53 PM    [facility]

## 2019-02-13 DIAGNOSIS — J3089 Other allergic rhinitis: Secondary | ICD-10-CM | POA: Diagnosis not present

## 2019-02-13 DIAGNOSIS — J301 Allergic rhinitis due to pollen: Secondary | ICD-10-CM | POA: Diagnosis not present

## 2019-02-13 DIAGNOSIS — J3081 Allergic rhinitis due to animal (cat) (dog) hair and dander: Secondary | ICD-10-CM | POA: Diagnosis not present

## 2019-02-20 DIAGNOSIS — J3089 Other allergic rhinitis: Secondary | ICD-10-CM | POA: Diagnosis not present

## 2019-02-20 DIAGNOSIS — J301 Allergic rhinitis due to pollen: Secondary | ICD-10-CM | POA: Diagnosis not present

## 2019-02-20 DIAGNOSIS — J3081 Allergic rhinitis due to animal (cat) (dog) hair and dander: Secondary | ICD-10-CM | POA: Diagnosis not present

## 2019-02-27 DIAGNOSIS — J301 Allergic rhinitis due to pollen: Secondary | ICD-10-CM | POA: Diagnosis not present

## 2019-02-27 DIAGNOSIS — J3081 Allergic rhinitis due to animal (cat) (dog) hair and dander: Secondary | ICD-10-CM | POA: Diagnosis not present

## 2019-02-27 DIAGNOSIS — J3089 Other allergic rhinitis: Secondary | ICD-10-CM | POA: Diagnosis not present

## 2019-03-06 DIAGNOSIS — J3081 Allergic rhinitis due to animal (cat) (dog) hair and dander: Secondary | ICD-10-CM | POA: Diagnosis not present

## 2019-03-06 DIAGNOSIS — J3089 Other allergic rhinitis: Secondary | ICD-10-CM | POA: Diagnosis not present

## 2019-03-06 DIAGNOSIS — J301 Allergic rhinitis due to pollen: Secondary | ICD-10-CM | POA: Diagnosis not present

## 2019-03-13 DIAGNOSIS — J3089 Other allergic rhinitis: Secondary | ICD-10-CM | POA: Diagnosis not present

## 2019-03-13 DIAGNOSIS — J3081 Allergic rhinitis due to animal (cat) (dog) hair and dander: Secondary | ICD-10-CM | POA: Diagnosis not present

## 2019-03-13 DIAGNOSIS — J301 Allergic rhinitis due to pollen: Secondary | ICD-10-CM | POA: Diagnosis not present

## 2019-03-14 DIAGNOSIS — E291 Testicular hypofunction: Secondary | ICD-10-CM | POA: Diagnosis not present

## 2019-03-17 DIAGNOSIS — J3081 Allergic rhinitis due to animal (cat) (dog) hair and dander: Secondary | ICD-10-CM | POA: Diagnosis not present

## 2019-03-17 DIAGNOSIS — J3089 Other allergic rhinitis: Secondary | ICD-10-CM | POA: Diagnosis not present

## 2019-03-17 DIAGNOSIS — J301 Allergic rhinitis due to pollen: Secondary | ICD-10-CM | POA: Diagnosis not present

## 2019-03-26 DIAGNOSIS — J3081 Allergic rhinitis due to animal (cat) (dog) hair and dander: Secondary | ICD-10-CM | POA: Diagnosis not present

## 2019-03-26 DIAGNOSIS — J301 Allergic rhinitis due to pollen: Secondary | ICD-10-CM | POA: Diagnosis not present

## 2019-03-26 DIAGNOSIS — J3089 Other allergic rhinitis: Secondary | ICD-10-CM | POA: Diagnosis not present

## 2019-03-27 DIAGNOSIS — H6192 Disorder of left external ear, unspecified: Secondary | ICD-10-CM | POA: Diagnosis not present

## 2019-03-27 DIAGNOSIS — N529 Male erectile dysfunction, unspecified: Secondary | ICD-10-CM | POA: Diagnosis not present

## 2019-03-27 DIAGNOSIS — E291 Testicular hypofunction: Secondary | ICD-10-CM | POA: Diagnosis not present

## 2019-03-31 DIAGNOSIS — J3081 Allergic rhinitis due to animal (cat) (dog) hair and dander: Secondary | ICD-10-CM | POA: Diagnosis not present

## 2019-03-31 DIAGNOSIS — J3089 Other allergic rhinitis: Secondary | ICD-10-CM | POA: Diagnosis not present

## 2019-03-31 DIAGNOSIS — J301 Allergic rhinitis due to pollen: Secondary | ICD-10-CM | POA: Diagnosis not present

## 2019-04-10 DIAGNOSIS — J3081 Allergic rhinitis due to animal (cat) (dog) hair and dander: Secondary | ICD-10-CM | POA: Diagnosis not present

## 2019-04-10 DIAGNOSIS — J301 Allergic rhinitis due to pollen: Secondary | ICD-10-CM | POA: Diagnosis not present

## 2019-04-10 DIAGNOSIS — J3089 Other allergic rhinitis: Secondary | ICD-10-CM | POA: Diagnosis not present

## 2019-04-15 DIAGNOSIS — J3089 Other allergic rhinitis: Secondary | ICD-10-CM | POA: Diagnosis not present

## 2019-04-15 DIAGNOSIS — J301 Allergic rhinitis due to pollen: Secondary | ICD-10-CM | POA: Diagnosis not present

## 2019-04-15 DIAGNOSIS — J3081 Allergic rhinitis due to animal (cat) (dog) hair and dander: Secondary | ICD-10-CM | POA: Diagnosis not present

## 2019-04-16 DIAGNOSIS — J301 Allergic rhinitis due to pollen: Secondary | ICD-10-CM | POA: Diagnosis not present

## 2019-04-16 DIAGNOSIS — J3089 Other allergic rhinitis: Secondary | ICD-10-CM | POA: Diagnosis not present

## 2019-04-16 DIAGNOSIS — J3081 Allergic rhinitis due to animal (cat) (dog) hair and dander: Secondary | ICD-10-CM | POA: Diagnosis not present

## 2019-04-23 ENCOUNTER — Other Ambulatory Visit: Payer: Self-pay | Admitting: Cardiovascular Disease

## 2019-04-23 MED ORDER — EZETIMIBE 10 MG PO TABS: 10.0000 mg | ORAL_TABLET | Freq: Every day | ORAL | 3 refills | Status: DC

## 2019-04-24 DIAGNOSIS — J3081 Allergic rhinitis due to animal (cat) (dog) hair and dander: Secondary | ICD-10-CM | POA: Diagnosis not present

## 2019-04-24 DIAGNOSIS — J301 Allergic rhinitis due to pollen: Secondary | ICD-10-CM | POA: Diagnosis not present

## 2019-04-24 DIAGNOSIS — J3089 Other allergic rhinitis: Secondary | ICD-10-CM | POA: Diagnosis not present

## 2019-04-29 DIAGNOSIS — E7800 Pure hypercholesterolemia, unspecified: Secondary | ICD-10-CM | POA: Diagnosis not present

## 2019-04-29 DIAGNOSIS — K219 Gastro-esophageal reflux disease without esophagitis: Secondary | ICD-10-CM | POA: Diagnosis not present

## 2019-04-29 DIAGNOSIS — E559 Vitamin D deficiency, unspecified: Secondary | ICD-10-CM | POA: Diagnosis not present

## 2019-04-29 DIAGNOSIS — E291 Testicular hypofunction: Secondary | ICD-10-CM | POA: Diagnosis not present

## 2019-04-29 DIAGNOSIS — J301 Allergic rhinitis due to pollen: Secondary | ICD-10-CM | POA: Diagnosis not present

## 2019-04-29 DIAGNOSIS — Z23 Encounter for immunization: Secondary | ICD-10-CM | POA: Diagnosis not present

## 2019-04-29 DIAGNOSIS — N529 Male erectile dysfunction, unspecified: Secondary | ICD-10-CM | POA: Diagnosis not present

## 2019-04-29 DIAGNOSIS — Z0000 Encounter for general adult medical examination without abnormal findings: Secondary | ICD-10-CM | POA: Diagnosis not present

## 2019-04-29 DIAGNOSIS — I2510 Atherosclerotic heart disease of native coronary artery without angina pectoris: Secondary | ICD-10-CM | POA: Diagnosis not present

## 2019-04-30 DIAGNOSIS — J301 Allergic rhinitis due to pollen: Secondary | ICD-10-CM | POA: Diagnosis not present

## 2019-04-30 DIAGNOSIS — J3089 Other allergic rhinitis: Secondary | ICD-10-CM | POA: Diagnosis not present

## 2019-04-30 DIAGNOSIS — J3081 Allergic rhinitis due to animal (cat) (dog) hair and dander: Secondary | ICD-10-CM | POA: Diagnosis not present

## 2019-05-01 DIAGNOSIS — L570 Actinic keratosis: Secondary | ICD-10-CM | POA: Diagnosis not present

## 2019-05-01 DIAGNOSIS — L918 Other hypertrophic disorders of the skin: Secondary | ICD-10-CM | POA: Diagnosis not present

## 2019-05-01 DIAGNOSIS — D1801 Hemangioma of skin and subcutaneous tissue: Secondary | ICD-10-CM | POA: Diagnosis not present

## 2019-05-07 DIAGNOSIS — J3089 Other allergic rhinitis: Secondary | ICD-10-CM | POA: Diagnosis not present

## 2019-05-07 DIAGNOSIS — J3081 Allergic rhinitis due to animal (cat) (dog) hair and dander: Secondary | ICD-10-CM | POA: Diagnosis not present

## 2019-05-07 DIAGNOSIS — J301 Allergic rhinitis due to pollen: Secondary | ICD-10-CM | POA: Diagnosis not present

## 2019-05-09 DIAGNOSIS — J3089 Other allergic rhinitis: Secondary | ICD-10-CM | POA: Diagnosis not present

## 2019-05-09 DIAGNOSIS — J3081 Allergic rhinitis due to animal (cat) (dog) hair and dander: Secondary | ICD-10-CM | POA: Diagnosis not present

## 2019-05-09 DIAGNOSIS — J301 Allergic rhinitis due to pollen: Secondary | ICD-10-CM | POA: Diagnosis not present

## 2019-05-14 DIAGNOSIS — J3081 Allergic rhinitis due to animal (cat) (dog) hair and dander: Secondary | ICD-10-CM | POA: Diagnosis not present

## 2019-05-14 DIAGNOSIS — J3089 Other allergic rhinitis: Secondary | ICD-10-CM | POA: Diagnosis not present

## 2019-05-14 DIAGNOSIS — J301 Allergic rhinitis due to pollen: Secondary | ICD-10-CM | POA: Diagnosis not present

## 2019-05-16 DIAGNOSIS — J3081 Allergic rhinitis due to animal (cat) (dog) hair and dander: Secondary | ICD-10-CM | POA: Diagnosis not present

## 2019-05-16 DIAGNOSIS — J301 Allergic rhinitis due to pollen: Secondary | ICD-10-CM | POA: Diagnosis not present

## 2019-05-16 DIAGNOSIS — J3089 Other allergic rhinitis: Secondary | ICD-10-CM | POA: Diagnosis not present

## 2019-05-21 DIAGNOSIS — J3089 Other allergic rhinitis: Secondary | ICD-10-CM | POA: Diagnosis not present

## 2019-05-21 DIAGNOSIS — J301 Allergic rhinitis due to pollen: Secondary | ICD-10-CM | POA: Diagnosis not present

## 2019-05-21 DIAGNOSIS — J3081 Allergic rhinitis due to animal (cat) (dog) hair and dander: Secondary | ICD-10-CM | POA: Diagnosis not present

## 2019-05-29 DIAGNOSIS — J3081 Allergic rhinitis due to animal (cat) (dog) hair and dander: Secondary | ICD-10-CM | POA: Diagnosis not present

## 2019-05-29 DIAGNOSIS — J301 Allergic rhinitis due to pollen: Secondary | ICD-10-CM | POA: Diagnosis not present

## 2019-05-29 DIAGNOSIS — J3089 Other allergic rhinitis: Secondary | ICD-10-CM | POA: Diagnosis not present

## 2019-06-05 DIAGNOSIS — J3089 Other allergic rhinitis: Secondary | ICD-10-CM | POA: Diagnosis not present

## 2019-06-05 DIAGNOSIS — J301 Allergic rhinitis due to pollen: Secondary | ICD-10-CM | POA: Diagnosis not present

## 2019-06-05 DIAGNOSIS — J3081 Allergic rhinitis due to animal (cat) (dog) hair and dander: Secondary | ICD-10-CM | POA: Diagnosis not present

## 2019-06-12 DIAGNOSIS — J3081 Allergic rhinitis due to animal (cat) (dog) hair and dander: Secondary | ICD-10-CM | POA: Diagnosis not present

## 2019-06-12 DIAGNOSIS — J3089 Other allergic rhinitis: Secondary | ICD-10-CM | POA: Diagnosis not present

## 2019-06-12 DIAGNOSIS — J301 Allergic rhinitis due to pollen: Secondary | ICD-10-CM | POA: Diagnosis not present

## 2019-06-17 DIAGNOSIS — H524 Presbyopia: Secondary | ICD-10-CM | POA: Diagnosis not present

## 2019-06-17 DIAGNOSIS — H5213 Myopia, bilateral: Secondary | ICD-10-CM | POA: Diagnosis not present

## 2019-06-18 DIAGNOSIS — J3081 Allergic rhinitis due to animal (cat) (dog) hair and dander: Secondary | ICD-10-CM | POA: Diagnosis not present

## 2019-06-18 DIAGNOSIS — J3089 Other allergic rhinitis: Secondary | ICD-10-CM | POA: Diagnosis not present

## 2019-06-18 DIAGNOSIS — J301 Allergic rhinitis due to pollen: Secondary | ICD-10-CM | POA: Diagnosis not present

## 2019-06-24 DIAGNOSIS — J3089 Other allergic rhinitis: Secondary | ICD-10-CM | POA: Diagnosis not present

## 2019-06-24 DIAGNOSIS — J3081 Allergic rhinitis due to animal (cat) (dog) hair and dander: Secondary | ICD-10-CM | POA: Diagnosis not present

## 2019-06-24 DIAGNOSIS — J301 Allergic rhinitis due to pollen: Secondary | ICD-10-CM | POA: Diagnosis not present

## 2019-06-30 DIAGNOSIS — J301 Allergic rhinitis due to pollen: Secondary | ICD-10-CM | POA: Diagnosis not present

## 2019-06-30 DIAGNOSIS — J3081 Allergic rhinitis due to animal (cat) (dog) hair and dander: Secondary | ICD-10-CM | POA: Diagnosis not present

## 2019-06-30 DIAGNOSIS — J3089 Other allergic rhinitis: Secondary | ICD-10-CM | POA: Diagnosis not present

## 2019-07-10 DIAGNOSIS — J3081 Allergic rhinitis due to animal (cat) (dog) hair and dander: Secondary | ICD-10-CM | POA: Diagnosis not present

## 2019-07-10 DIAGNOSIS — J301 Allergic rhinitis due to pollen: Secondary | ICD-10-CM | POA: Diagnosis not present

## 2019-07-10 DIAGNOSIS — J3089 Other allergic rhinitis: Secondary | ICD-10-CM | POA: Diagnosis not present

## 2019-07-14 DIAGNOSIS — J301 Allergic rhinitis due to pollen: Secondary | ICD-10-CM | POA: Diagnosis not present

## 2019-07-14 DIAGNOSIS — J3081 Allergic rhinitis due to animal (cat) (dog) hair and dander: Secondary | ICD-10-CM | POA: Diagnosis not present

## 2019-07-14 DIAGNOSIS — J3089 Other allergic rhinitis: Secondary | ICD-10-CM | POA: Diagnosis not present

## 2019-07-21 DIAGNOSIS — J301 Allergic rhinitis due to pollen: Secondary | ICD-10-CM | POA: Diagnosis not present

## 2019-07-21 DIAGNOSIS — J3089 Other allergic rhinitis: Secondary | ICD-10-CM | POA: Diagnosis not present

## 2019-07-21 DIAGNOSIS — J3081 Allergic rhinitis due to animal (cat) (dog) hair and dander: Secondary | ICD-10-CM | POA: Diagnosis not present

## 2019-07-24 ENCOUNTER — Telehealth: Payer: Self-pay | Admitting: Cardiovascular Disease

## 2019-07-24 NOTE — Telephone Encounter (Signed)
Spoke with patient who states he has had episodes occurring 1 x per week every week for 3 months, usually on a Wednesday. He continues to run for exercise and spinning 1-2 times per week and now lifting weights more than ever before.   Started testosterone shots on Monday mornings; states this is the only change in recent medical history States that when these episodes occur, he is usually sitting and reading when he starts feeling pressure in his head, vision slightly impaired then, a second later it goes down his throat, he has a funny taste in his mouth then the feeling travels down to his stomach then goes away, no n/v. Whole episode seems to last about 2 sec, then he continues with his usual activities without difficulty.  Yesterday it happened while he was bent forward pulling weeds in the yard. States he had the same feeling described above and he sat down on the grass until he felt better.  Reports history of low resting pulse, and has gotten light-headed when he is training hard, but this feels different No pain/pressure or discomfort when he is lifting weights - these episodes do not occur with weight-lifting or during exercise Denies abnormal bowel movements or blood in stool; denies abnormal bleeding Mother had CVA, no hx of AAA that patient is aware of Leaving town tomorrow and will return on October 1. I advised that I will review with Dr. Acie Fredrickson and call him back with his advice. Patient verbalized understanding and agreement and thanked me for the call.

## 2019-07-24 NOTE — Telephone Encounter (Signed)
Spoke with patient and confirmed receipt of the additional message. I advised patient that per Dr. Acie Fredrickson, we can either schedule an appointment for him to come in next week or order a monitor for him to wear for 30 days. Patient states he would prefer to be seen and is scheduled for Dr. Acie Fredrickson for 10/1. He asked about lab appointment later in the month and I advised him to come to appointment having fasted for 6 hours so that lab work can be completed at the appointment. Patient verbalized understanding and agreement with plan and thanked me for the call.

## 2019-07-24 NOTE — Telephone Encounter (Signed)
New Message    Patient states at least once a week he feels dizzy and he doesn't black out but he has blurred vision.  He also has a taste that travels down his throat to his stomach then he's nauseous.  It last for 1 to 3 seconds and then goes away.  Please call patient to advise.

## 2019-07-24 NOTE — Telephone Encounter (Signed)
Patient called back because he had a few more things to tell  Sharyn Lull to follow up from their earlier conversation.  1. He is taking allergy shots 1x a week 2. He is using a nasal spray in each nostril every morning  3. He takes 2 Aleve per day, one in the morning and one at bedtime.   He felt this information was important for [facility] and Dr. Acie Fredrickson to know

## 2019-07-31 ENCOUNTER — Other Ambulatory Visit: Payer: Self-pay

## 2019-07-31 ENCOUNTER — Encounter: Payer: Self-pay | Admitting: Cardiovascular Disease

## 2019-07-31 ENCOUNTER — Ambulatory Visit (INDEPENDENT_AMBULATORY_CARE_PROVIDER_SITE_OTHER): Payer: PPO | Admitting: Cardiovascular Disease

## 2019-07-31 VITALS — BP 106/80 | HR 61 | Ht 68.5 in | Wt 143.8 lb

## 2019-07-31 DIAGNOSIS — T466X5A Adverse effect of antihyperlipidemic and antiarteriosclerotic drugs, initial encounter: Secondary | ICD-10-CM

## 2019-07-31 DIAGNOSIS — J3081 Allergic rhinitis due to animal (cat) (dog) hair and dander: Secondary | ICD-10-CM | POA: Diagnosis not present

## 2019-07-31 DIAGNOSIS — E782 Mixed hyperlipidemia: Secondary | ICD-10-CM

## 2019-07-31 DIAGNOSIS — J301 Allergic rhinitis due to pollen: Secondary | ICD-10-CM | POA: Diagnosis not present

## 2019-07-31 DIAGNOSIS — I2510 Atherosclerotic heart disease of native coronary artery without angina pectoris: Secondary | ICD-10-CM | POA: Diagnosis not present

## 2019-07-31 DIAGNOSIS — R55 Syncope and collapse: Secondary | ICD-10-CM

## 2019-07-31 DIAGNOSIS — G720 Drug-induced myopathy: Secondary | ICD-10-CM

## 2019-07-31 DIAGNOSIS — J3089 Other allergic rhinitis: Secondary | ICD-10-CM | POA: Diagnosis not present

## 2019-07-31 NOTE — Progress Notes (Signed)
Cardiology Office Note   Date:  07/31/2019   ID:  William Bernard, DOB [birth year], MRN [id number]  PCP:  Karlene Einstein, MD  Cardiologist:   Mertie Moores, MD   Chief Complaint  Patient presents with  . Coronary Artery Disease   Problem list: 1. Coronary artery disease 2. Hyperlipidemia [name] is a 73 year old gentleman with a history of coronary artery disease. Status post PTCA and stenting of his left anterior descending artery 2005. He's status post cutting balloon atherectomy to the stent in November of 2005. He also has a history of hypercholesterolemia and elevated CPK levels. He's had elevated liver enzymes with multiple statins. He's been able to tolerate Lipitor 40 mg a day without too much difficulty.  He has retired since I last saw him. He still works out on a regular basis - works out at [employer] regularly. He has cramping in his calves when he runs. He runs on trails which causes him to run on his toes a lot. He thinks that this may be the cause of his calf cramps. He denies any chest pain with exercise.  February 06, 2013:  William Bernard is doing well. He still has lots of muscle cramps associated with the statin.   February 16, 2014:  William Bernard is doing well. 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 Still runs,   Oct. 26, 2015:  William Bernard is doing well. Usual aches and pains.  Still exercising.  He cut his atorvastatin to 20 mg last year ( at the recommendation of [employer]). Also started taking Vit D Also taking Zetia   January 15, 2015:   William Bernard is a 73 y.o. male who presents for follow up of his CAD and thanksgiving. Still having lots of leg cramps - presumably due to statin.  Not running as much. Spinning more  Jan. 18, 2017:  Doing well.  Scheduled for blood work  Is on Pralulent and LDL is now 99.   Is being seen in Lipid clinic. Does not have leg / muscle aching since being off the statin . Does not want to restart any statin  No angina.   Very active.   Still exercising regularly   -runs regularly ,   Jan. 23, 2018;  Doing well. No episodes of chest pain or shortness breath. He is still exercising on a regular basis. Has several questions Is on Pralulent.   And also is on Zetia.   Last LDL is 86 ([facility])   LDL - 86 Chol - 177 Trigs - 64 HDL - 77 Non - HDL 100  Takes Advil for muscle soreness when he lifts weights.  Questions about his wife,   A bit overweight.    Feb. 8, 2019:  Doing well,   Is cycling instead of running ( has plantar fascitis)  Notices that he is slowing down.  Doesn't recover as fast when he reaches his aneorobic threshold.    July 31, 2019: William Bernard is seen today for follow-up visit he has been having some episodes of near syncope recently.  For the past weeks, typically on Wed. AM While reading  Has pressure in his head,  Gets nauseous, gets a taste in his mouth, feeling goes down to stomach. Last 3-4 seconds Will occur once and not again    Still running ,  Runs 4 miles a day , 2-3 times a week and 7 miles on the weekend. Lifting light weights.   Takes allergy shots on Mondays and Tuesdays.  Takes testosterone shots on Monday s   Eating regularly , cereal and banana for breakfast   Does not take his BP   Past Medical History:  Diagnosis Date  . Coronary artery disease    post PTCA and stenting of his LAD in 2005, he is statuts post Cutting Balloon atherectomy of the stent in November 2005    . Elevated CPK   . Elevated liver enzymes    due to multiple statin medication  . Hyperlipidemia     Past Surgical History:  Procedure Laterality Date  . CARDIAC CATHETERIZATION  09/23/2004   Subacute thrombosis of the previous long Taxus stent to the mid LAD with residual thrombus -- Residual disease involving the ostium of the first diagonal branch -- Ulcerated plaque involving the right coronary artery.  . CORONARY ANGIOPLASTY  09/26/2004    Successful PTCA and cutting balloon of the proximal  left anterior descending artery --  Partially successful angioplasty involving the first diagonal vessel We will continue with Integrilin, Plavix.  He will likely have to take Plavix lifelong  . CORONARY ANGIOPLASTY WITH STENT PLACEMENT  12/25/2003   Est. EF of 50%. -- stenting of LAD -- Single vessel coronary artery disease involving the LAD and diagonal system   . EYE SURGERY    . KNEE SURGERY    . TONSILLECTOMY    . VASECTOMY       Current Outpatient Medications  Medication Sig Dispense Refill  . aspirin 81 MG tablet Take 81 mg by mouth daily.      [name] azelastine (OPTIVAR) 0.05 % ophthalmic solution Place 2 drops into both eyes 2 (two) times daily.    . Cholecalciferol (VITAMIN D) 2000 UNITS CAPS Take 1 capsule (2,000 Units total) by mouth daily. 30 capsule   . clopidogrel (PLAVIX) 75 MG tablet Take 75 mg by mouth daily.      . cyclobenzaprine (FLEXERIL) 5 MG tablet Take 1 tablet by mouth as needed.    [name] EPINEPHrine 0.3 mg/0.3 mL IJ SOAJ injection INJ UTD PRN    . Evolocumab 140 MG/ML SOAJ Inject 140mg  subcutaneously every 2 weeks 2 pen 10  . ezetimibe (ZETIA) 10 MG tablet Take 1 tablet (10 mg total) by mouth daily. 90 tablet 3  . Fexofenadine HCl (ALLEGRA PO) Take 1 tablet by mouth daily.    . fluticasone (FLONASE) 50 MCG/ACT nasal spray Place 1 spray into both nostrils daily.    . Naproxen Sodium (ALEVE) 220 MG CAPS Take 1 capsule by mouth as needed.    . testosterone cypionate (DEPOTESTOSTERONE CYPIONATE) 200 MG/ML injection Inject 0.3 mLs into the muscle once a week.     [name] UNABLE TO FIND once a week. Med Name:Allergy shot     No current facility-administered medications for this visit.     Allergies:   Niaspan [niacin er] and Simvastatin    Social History:  The patient  reports that he has never smoked. He has never used smokeless tobacco. He reports that he does not drink alcohol or use drugs.   Family History:  The patient's family history includes Diabetes in his mother;  Heart failure in his mother; Hyperlipidemia in his mother; Liver disease in his father; Transient ischemic attack in his mother.    ROS: as per current hx . Otherwise negativ e  Physical Exam: Blood pressure 106/80, pulse 61, height 5' 8.5" (1.74 m), weight 143 lb 12.8 oz (65.2 kg), SpO2 97 %.  GEN:  Well nourished, well developed in  no acute distress HEENT: Normal NECK: No JVD; No carotid bruits LYMPHATICS: No lymphadenopathy CARDIAC: RRR , no murmurs, rubs, gallops RESPIRATORY:  Clear to auscultation without rales, wheezing or rhonchi  ABDOMEN: Soft, non-tender, non-distended MUSCULOSKELETAL:  No edema; No deformity  SKIN: Warm and dry NEUROLOGIC:  Alert and oriented x 3    EKG: July 31, 2019: Normal sinus rhythm at 61.  Marked sinus arrhythmia.  Otherwise normal EKG. Recent Labs: No results found for requested labs within last 8760 hours.    Lipid Panel    Component Value Date/Time   CHOL 151 11/30/2017 0813   TRIG 63 11/30/2017 0813   HDL 70 11/30/2017 0813   CHOLHDL 2.2 11/30/2017 0813   CHOLHDL 2.5 11/17/2015 0951   VLDL 11 11/17/2015 0951   LDLCALC 68 11/30/2017 0813      Wt Readings from Last 3 Encounters:  07/31/19 143 lb 12.8 oz (65.2 kg)  02/12/19 143 lb (64.9 kg)  12/07/17 143 lb 6.4 oz (65 kg)      Other studies Reviewed: Additional studies/ records that were reviewed today include: . Review of the above records demonstrates:    ASSESSMENT AND PLAN:  1. Coronary artery disease-  No angia   2. Hyperlipidemia- ckeck levels   3.  Near syncope -   I think he is becoming hypotensive.  Encouraged him to increase his intake of protein in the morning and also to increase his salt intake in the form of V8 juice every day.  4.. hypotestosterone _ is on supplement.   Will check testosterone    Current medicines are reviewed at length with the patient today.  The patient has concerns regarding medicines.  The following changes have been made:  no  change  Labs/ tests ordered today include:   Orders Placed This Encounter  Procedures  . Testosterone  . Lipid Profile  . Basic Metabolic Panel (BMET)  . Hepatic function panel  . EKG 12-Lead    Disposition:   FU with me in 1 year.    Signed, Mertie Moores, MD  07/31/2019 5:36 PM    [facility] [place], [place], [place]  [address] Phone: [phone]; Fax: [phone]

## 2019-07-31 NOTE — Patient Instructions (Addendum)
Medication Instructions:  Your physician recommends that you continue on your current medications as directed. Please refer to the Current Medication list given to you today.  If you need a refill on your cardiac medications before your next appointment, please call your pharmacy.   Lab work: TODAY - testosterone level, cholesterol, liver panel, basic metabolic panel  If you have labs (blood work) drawn today and your tests are completely normal, you will receive your results only by: [name] MyChart Message (if you have MyChart) OR . A paper copy in the mail If you have any lab test that is abnormal or we need to change your treatment, we will call you to review the results.   Testing/Procedures: None Ordered   Follow-Up: At [facility], you and your health needs are our priority.  As part of our continuing mission to provide you with exceptional heart care, we have created designated Provider Care Teams.  These Care Teams include your primary Cardiologist (physician) and Advanced Practice Providers (APPs -  Physician Assistants and Nurse Practitioners) who all work together to provide you with the care you need, when you need it. You will need a follow up appointment in:  5 months.  Please call our office 2 months in advance to schedule this appointment.  You may see Mertie Moores, MD or one of the following Advanced Practice Providers on your designated Care Team: Richardson Dopp, PA-C [place], [place] . Daune Perch, NP

## 2019-08-01 LAB — LIPID PANEL
Chol/HDL Ratio: 2.1 ratio (ref 0.0–5.0)
Cholesterol, Total: 146 mg/dL (ref 100–199)
HDL: 70 mg/dL (ref >39)
LDL Chol Calc (NIH): 63 mg/dL (ref 0–99)
Triglycerides: 66 mg/dL (ref 0–149)
VLDL Cholesterol Cal: 13 mg/dL (ref 5–40)

## 2019-08-01 LAB — HEPATIC FUNCTION PANEL
ALT: 17 IU/L (ref 0–44)
AST: 33 IU/L (ref 0–40)
Albumin: 4.5 g/dL (ref 3.7–4.7)
Alkaline Phosphatase: 79 IU/L (ref 39–117)
Bilirubin Total: 0.6 mg/dL (ref 0.0–1.2)
Bilirubin, Direct: 0.19 mg/dL (ref 0.00–0.40)
Total Protein: 6.9 g/dL (ref 6.0–8.5)

## 2019-08-01 LAB — BASIC METABOLIC PANEL
BUN/Creatinine Ratio: 16 (ref 10–24)
BUN: 18 mg/dL (ref 8–27)
CO2: 25 mmol/L (ref 20–29)
Calcium: 9.7 mg/dL (ref 8.6–10.2)
Chloride: 102 mmol/L (ref 96–106)
Creatinine, Ser: 1.16 mg/dL (ref 0.76–1.27)
GFR calc Af Amer: 72 mL/min/{1.73_m2} (ref >59)
GFR calc non Af Amer: 63 mL/min/{1.73_m2} (ref >59)
Glucose: 88 mg/dL (ref 65–99)
Potassium: 4.6 mmol/L (ref 3.5–5.2)
Sodium: 140 mmol/L (ref 134–144)

## 2019-08-01 LAB — TESTOSTERONE: Testosterone: >1500 ng/dL — ABNORMAL HIGH (ref 264–916)

## 2019-08-05 DIAGNOSIS — J3089 Other allergic rhinitis: Secondary | ICD-10-CM | POA: Diagnosis not present

## 2019-08-05 DIAGNOSIS — J3081 Allergic rhinitis due to animal (cat) (dog) hair and dander: Secondary | ICD-10-CM | POA: Diagnosis not present

## 2019-08-05 DIAGNOSIS — J301 Allergic rhinitis due to pollen: Secondary | ICD-10-CM | POA: Diagnosis not present

## 2019-08-07 ENCOUNTER — Telehealth: Payer: Self-pay | Admitting: Cardiovascular Disease

## 2019-08-07 NOTE — Telephone Encounter (Signed)
Spoke with patient who states he received my message about his lab work . He states Dr. Juluis Pitch' at [facility] in [place] is the provider that prescribes testosterone and they have discussed the dosage Patient states he has had less symptoms since talking to Dr. Acie Fredrickson and talking to Dr. Juluis Pitch' and is feeling well. I advised him to call back with questions or concerns prior to follow-up and he thanked me for the call.

## 2019-08-07 NOTE — Telephone Encounter (Signed)
New message:    Patient stating someone called concerning some results. Please call patient back.

## 2019-08-12 DIAGNOSIS — J3089 Other allergic rhinitis: Secondary | ICD-10-CM | POA: Diagnosis not present

## 2019-08-12 DIAGNOSIS — J3081 Allergic rhinitis due to animal (cat) (dog) hair and dander: Secondary | ICD-10-CM | POA: Diagnosis not present

## 2019-08-12 DIAGNOSIS — J301 Allergic rhinitis due to pollen: Secondary | ICD-10-CM | POA: Diagnosis not present

## 2019-08-20 ENCOUNTER — Other Ambulatory Visit: Payer: PPO

## 2019-08-20 DIAGNOSIS — J301 Allergic rhinitis due to pollen: Secondary | ICD-10-CM | POA: Diagnosis not present

## 2019-08-20 DIAGNOSIS — J3081 Allergic rhinitis due to animal (cat) (dog) hair and dander: Secondary | ICD-10-CM | POA: Diagnosis not present

## 2019-08-20 DIAGNOSIS — Z20828 Contact with and (suspected) exposure to other viral communicable diseases: Secondary | ICD-10-CM | POA: Diagnosis not present

## 2019-08-20 DIAGNOSIS — J029 Acute pharyngitis, unspecified: Secondary | ICD-10-CM | POA: Diagnosis not present

## 2019-08-20 DIAGNOSIS — J3089 Other allergic rhinitis: Secondary | ICD-10-CM | POA: Diagnosis not present

## 2019-08-29 DIAGNOSIS — J301 Allergic rhinitis due to pollen: Secondary | ICD-10-CM | POA: Diagnosis not present

## 2019-08-29 DIAGNOSIS — J3089 Other allergic rhinitis: Secondary | ICD-10-CM | POA: Diagnosis not present

## 2019-08-29 DIAGNOSIS — J3081 Allergic rhinitis due to animal (cat) (dog) hair and dander: Secondary | ICD-10-CM | POA: Diagnosis not present

## 2019-09-04 DIAGNOSIS — J3081 Allergic rhinitis due to animal (cat) (dog) hair and dander: Secondary | ICD-10-CM | POA: Diagnosis not present

## 2019-09-04 DIAGNOSIS — J301 Allergic rhinitis due to pollen: Secondary | ICD-10-CM | POA: Diagnosis not present

## 2019-09-04 DIAGNOSIS — J3089 Other allergic rhinitis: Secondary | ICD-10-CM | POA: Diagnosis not present

## 2019-09-15 DIAGNOSIS — J3089 Other allergic rhinitis: Secondary | ICD-10-CM | POA: Diagnosis not present

## 2019-09-15 DIAGNOSIS — J3081 Allergic rhinitis due to animal (cat) (dog) hair and dander: Secondary | ICD-10-CM | POA: Diagnosis not present

## 2019-09-15 DIAGNOSIS — J301 Allergic rhinitis due to pollen: Secondary | ICD-10-CM | POA: Diagnosis not present

## 2019-09-16 ENCOUNTER — Other Ambulatory Visit: Payer: Self-pay | Admitting: Cardiovascular Disease

## 2019-09-22 DIAGNOSIS — J3089 Other allergic rhinitis: Secondary | ICD-10-CM | POA: Diagnosis not present

## 2019-09-22 DIAGNOSIS — J301 Allergic rhinitis due to pollen: Secondary | ICD-10-CM | POA: Diagnosis not present

## 2019-09-22 DIAGNOSIS — J3081 Allergic rhinitis due to animal (cat) (dog) hair and dander: Secondary | ICD-10-CM | POA: Diagnosis not present

## 2019-09-30 DIAGNOSIS — J301 Allergic rhinitis due to pollen: Secondary | ICD-10-CM | POA: Diagnosis not present

## 2019-09-30 DIAGNOSIS — J3089 Other allergic rhinitis: Secondary | ICD-10-CM | POA: Diagnosis not present

## 2019-09-30 DIAGNOSIS — J3081 Allergic rhinitis due to animal (cat) (dog) hair and dander: Secondary | ICD-10-CM | POA: Diagnosis not present

## 2019-10-02 DIAGNOSIS — J3089 Other allergic rhinitis: Secondary | ICD-10-CM | POA: Diagnosis not present

## 2019-10-02 DIAGNOSIS — J301 Allergic rhinitis due to pollen: Secondary | ICD-10-CM | POA: Diagnosis not present

## 2019-10-02 DIAGNOSIS — J3081 Allergic rhinitis due to animal (cat) (dog) hair and dander: Secondary | ICD-10-CM | POA: Diagnosis not present

## 2019-10-10 DIAGNOSIS — J3081 Allergic rhinitis due to animal (cat) (dog) hair and dander: Secondary | ICD-10-CM | POA: Diagnosis not present

## 2019-10-10 DIAGNOSIS — J3089 Other allergic rhinitis: Secondary | ICD-10-CM | POA: Diagnosis not present

## 2019-10-10 DIAGNOSIS — J301 Allergic rhinitis due to pollen: Secondary | ICD-10-CM | POA: Diagnosis not present

## 2019-10-17 DIAGNOSIS — J301 Allergic rhinitis due to pollen: Secondary | ICD-10-CM | POA: Diagnosis not present

## 2019-10-17 DIAGNOSIS — J3081 Allergic rhinitis due to animal (cat) (dog) hair and dander: Secondary | ICD-10-CM | POA: Diagnosis not present

## 2019-10-17 DIAGNOSIS — J3089 Other allergic rhinitis: Secondary | ICD-10-CM | POA: Diagnosis not present

## 2019-10-29 DIAGNOSIS — T466X5A Adverse effect of antihyperlipidemic and antiarteriosclerotic drugs, initial encounter: Secondary | ICD-10-CM | POA: Insufficient documentation

## 2019-10-29 DIAGNOSIS — G720 Drug-induced myopathy: Secondary | ICD-10-CM | POA: Insufficient documentation

## 2019-11-03 DIAGNOSIS — J301 Allergic rhinitis due to pollen: Secondary | ICD-10-CM | POA: Diagnosis not present

## 2019-11-03 DIAGNOSIS — J3081 Allergic rhinitis due to animal (cat) (dog) hair and dander: Secondary | ICD-10-CM | POA: Diagnosis not present

## 2019-11-03 DIAGNOSIS — J3089 Other allergic rhinitis: Secondary | ICD-10-CM | POA: Diagnosis not present

## 2019-11-10 DIAGNOSIS — J301 Allergic rhinitis due to pollen: Secondary | ICD-10-CM | POA: Diagnosis not present

## 2019-11-10 DIAGNOSIS — J3089 Other allergic rhinitis: Secondary | ICD-10-CM | POA: Diagnosis not present

## 2019-11-10 DIAGNOSIS — J3081 Allergic rhinitis due to animal (cat) (dog) hair and dander: Secondary | ICD-10-CM | POA: Diagnosis not present

## 2019-11-18 DIAGNOSIS — J301 Allergic rhinitis due to pollen: Secondary | ICD-10-CM | POA: Diagnosis not present

## 2019-11-18 DIAGNOSIS — J3081 Allergic rhinitis due to animal (cat) (dog) hair and dander: Secondary | ICD-10-CM | POA: Diagnosis not present

## 2019-11-18 DIAGNOSIS — J3089 Other allergic rhinitis: Secondary | ICD-10-CM | POA: Diagnosis not present

## 2019-11-24 DIAGNOSIS — J301 Allergic rhinitis due to pollen: Secondary | ICD-10-CM | POA: Diagnosis not present

## 2019-11-24 DIAGNOSIS — J3081 Allergic rhinitis due to animal (cat) (dog) hair and dander: Secondary | ICD-10-CM | POA: Diagnosis not present

## 2019-11-24 DIAGNOSIS — J3089 Other allergic rhinitis: Secondary | ICD-10-CM | POA: Diagnosis not present

## 2019-12-01 DIAGNOSIS — J301 Allergic rhinitis due to pollen: Secondary | ICD-10-CM | POA: Diagnosis not present

## 2019-12-01 DIAGNOSIS — J3081 Allergic rhinitis due to animal (cat) (dog) hair and dander: Secondary | ICD-10-CM | POA: Diagnosis not present

## 2019-12-01 DIAGNOSIS — J3089 Other allergic rhinitis: Secondary | ICD-10-CM | POA: Diagnosis not present

## 2019-12-11 DIAGNOSIS — J3081 Allergic rhinitis due to animal (cat) (dog) hair and dander: Secondary | ICD-10-CM | POA: Diagnosis not present

## 2019-12-11 DIAGNOSIS — J3089 Other allergic rhinitis: Secondary | ICD-10-CM | POA: Diagnosis not present

## 2019-12-11 DIAGNOSIS — J301 Allergic rhinitis due to pollen: Secondary | ICD-10-CM | POA: Diagnosis not present

## 2020-01-05 ENCOUNTER — Other Ambulatory Visit: Payer: PPO

## 2020-01-05 ENCOUNTER — Ambulatory Visit: Payer: PPO | Admitting: Cardiovascular Disease

## 2020-01-09 ENCOUNTER — Encounter: Payer: Self-pay | Admitting: Cardiovascular Disease

## 2020-01-09 ENCOUNTER — Other Ambulatory Visit: Payer: PPO | Admitting: *Deleted

## 2020-01-09 ENCOUNTER — Other Ambulatory Visit: Payer: Self-pay

## 2020-01-09 ENCOUNTER — Ambulatory Visit: Payer: PPO | Admitting: Cardiovascular Disease

## 2020-01-09 VITALS — BP 122/80 | HR 78 | Ht 68.5 in | Wt 142.8 lb

## 2020-01-09 DIAGNOSIS — E782 Mixed hyperlipidemia: Secondary | ICD-10-CM

## 2020-01-09 DIAGNOSIS — I2510 Atherosclerotic heart disease of native coronary artery without angina pectoris: Secondary | ICD-10-CM

## 2020-01-09 LAB — BASIC METABOLIC PANEL
BUN/Creatinine Ratio: 21 (ref 10–24)
BUN: 20 mg/dL (ref 8–27)
CO2: 20 mmol/L (ref 20–29)
Calcium: 8.9 mg/dL (ref 8.6–10.2)
Chloride: 106 mmol/L (ref 96–106)
Creatinine, Ser: 0.97 mg/dL (ref 0.76–1.27)
GFR calc Af Amer: 89 mL/min/{1.73_m2} (ref >59)
GFR calc non Af Amer: 77 mL/min/{1.73_m2} (ref >59)
Glucose: 86 mg/dL (ref 65–99)
Potassium: 4 mmol/L (ref 3.5–5.2)
Sodium: 141 mmol/L (ref 134–144)

## 2020-01-09 LAB — LIPID PANEL
Chol/HDL Ratio: 2.2 ratio (ref 0.0–5.0)
Cholesterol, Total: 153 mg/dL (ref 100–199)
HDL: 71 mg/dL (ref >39)
LDL Chol Calc (NIH): 69 mg/dL (ref 0–99)
Triglycerides: 63 mg/dL (ref 0–149)
VLDL Cholesterol Cal: 13 mg/dL (ref 5–40)

## 2020-01-09 LAB — HEPATIC FUNCTION PANEL
ALT: 15 IU/L (ref 0–44)
AST: 37 IU/L (ref 0–40)
Albumin: 4.3 g/dL (ref 3.7–4.7)
Alkaline Phosphatase: 70 IU/L (ref 39–117)
Bilirubin Total: 0.8 mg/dL (ref 0.0–1.2)
Bilirubin, Direct: 0.23 mg/dL (ref 0.00–0.40)
Total Protein: 6.6 g/dL (ref 6.0–8.5)

## 2020-01-09 NOTE — Patient Instructions (Signed)
Medication Instructions:  *If you need a refill on your cardiac medications before your next appointment, please call your pharmacy*  Lab Work: If you have labs (blood work) drawn today and your tests are completely normal, you will receive your results only by: [name] MyChart Message (if you have MyChart) OR . A paper copy in the mail If you have any lab test that is abnormal or we need to change your treatment, we will call you to review the results.  Follow-Up: At [facility], you and your health needs are our priority.  As part of our continuing mission to provide you with exceptional heart care, we have created designated Provider Care Teams.  These Care Teams include your primary Cardiologist (physician) and Advanced Practice Providers (APPs -  Physician Assistants and Nurse Practitioners) who all work together to provide you with the care you need, when you need it.  We recommend signing up for the patient portal called "MyChart".  Sign up information is provided on this After Visit Summary.  MyChart is used to connect with patients for Virtual Visits (Telemedicine).  Patients are able to view lab/test results, encounter notes, upcoming appointments, etc.  Non-urgent messages can be sent to your provider as well.   To learn more about what you can do with MyChart, go to [email or web address].    Your next appointment:   6 month(s)  The format for your next appointment:   In Person  Provider:   You may see Mertie Moores, MD or one of the following Advanced Practice Providers on your designated Care Team:    Richardson Dopp, PA-C  [place], [place]

## 2020-01-09 NOTE — Progress Notes (Signed)
Cardiology Office Note   Date:  01/09/2020   ID:  William Bernard, DOB [birth year], MRN [id number]  PCP:  Karlene Einstein, MD  Cardiologist:   Mertie Moores, MD   No chief complaint on file.  Problem list: 1. Coronary artery disease 2. Hyperlipidemia William Bernard is a 74 year old gentleman with a history of coronary artery disease. Status post PTCA and stenting of his left anterior descending artery 2005. He's status post cutting balloon atherectomy to the stent in November of 2005. He also has a history of hypercholesterolemia and elevated CPK levels. He's had elevated liver enzymes with multiple statins. He's been able to tolerate Lipitor 40 mg a day without too much difficulty.  He has retired since I last saw him. He still works out on a regular basis - works out at [employer] regularly. He has cramping in his calves when he runs. He runs on trails which causes him to run on his toes a lot. He thinks that this may be the cause of his calf cramps. He denies any chest pain with exercise.  February 06, 2013:  William Bernard is doing well. He still has lots of muscle cramps associated with the statin.   February 16, 2014:  William Bernard is doing well. 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 Still runs,   Oct. 26, 2015:  William Bernard is doing well. Usual aches and pains.  Still exercising.  He cut his atorvastatin to 20 mg last year ( at the recommendation of [employer]). Also started taking Vit D Also taking Zetia   January 15, 2015:   William Bernard is a 74 y.o. male who presents for follow up of his CAD and thanksgiving. Still having lots of leg cramps - presumably due to statin.  Not running as much. Spinning more  Jan. 18, 2017:  Doing well.  Scheduled for blood work  Is on Pralulent and LDL is now 99.   Is being seen in Lipid clinic. Does not have leg / muscle aching since being off the statin . Does not want to restart any statin  No angina.   Very active.  Still exercising regularly   -runs  regularly ,   Jan. 23, 2018;  Doing well. No episodes of chest pain or shortness breath. He is still exercising on a regular basis. Has several questions Is on Pralulent.   And also is on Zetia.   Last LDL is 86 ([facility])   LDL - 86 Chol - 177 Trigs - 64 HDL - 77 Non - HDL 100  Takes Advil for muscle soreness when he lifts weights.  Questions about his wife,   A bit overweight.    Feb. 8, 2019:  Doing well,   Is cycling instead of running ( has plantar fascitis)  Notices that he is slowing down.  Doesn't recover as fast when he reaches his aneorobic threshold.    July 31, 2019: William Bernard is seen today for follow-up visit he has been having some episodes of near syncope recently.  For the past weeks, typically on Wed. AM While reading  Has pressure in his head,  Gets nauseous, gets a taste in his mouth, feeling goes down to stomach. Last 3-4 seconds Will occur once and not again    Still running ,  Runs 4 miles a day , 2-3 times a week and 7 miles on the weekend. Lifting light weights.   Takes allergy shots on Mondays and Tuesdays.  Takes testosterone shots on Monday s  Eating regularly , cereal and banana for breakfast   Does not take his BP   January 09, 2020:  William Bernard is seen today for follow-up visit.  He is a very active gentleman is been an avid runner all of his life.  He he has a history of coronary artery disease and hyperlipidemia. No CP , no dyspnea.   Got his first covid vaccine yesterday  No further episodes of presyncope. - he is better after getting more protein and salt.     Past Medical History:  Diagnosis Date  . Coronary artery disease    post PTCA and stenting of his LAD in 2005, he is statuts post Cutting Balloon atherectomy of the stent in November 2005    . Elevated CPK   . Elevated liver enzymes    due to multiple statin medication  . Hyperlipidemia     Past Surgical History:  Procedure Laterality Date  . CARDIAC CATHETERIZATION   09/23/2004   Subacute thrombosis of the previous long Taxus stent to the mid LAD with residual thrombus -- Residual disease involving the ostium of the first diagonal branch -- Ulcerated plaque involving the right coronary artery.  . CORONARY ANGIOPLASTY  09/26/2004    Successful PTCA and cutting balloon of the proximal left anterior descending artery --  Partially successful angioplasty involving the first diagonal vessel We will continue with Integrilin, Plavix.  He will likely have to take Plavix lifelong  . CORONARY ANGIOPLASTY WITH STENT PLACEMENT  12/25/2003   Est. EF of 50%. -- stenting of LAD -- Single vessel coronary artery disease involving the LAD and diagonal system   . EYE SURGERY    . KNEE SURGERY    . TONSILLECTOMY    . VASECTOMY       Current Outpatient Medications  Medication Sig Dispense Refill  . aspirin 81 MG tablet Take 81 mg by mouth daily.      [name] azelastine (OPTIVAR) 0.05 % ophthalmic solution Place 2 drops into both eyes 2 (two) times daily.    . Cholecalciferol (VITAMIN D) 2000 UNITS CAPS Take 1 capsule (2,000 Units total) by mouth daily. 30 capsule   . clopidogrel (PLAVIX) 75 MG tablet Take 75 mg by mouth daily.      . cyclobenzaprine (FLEXERIL) 5 MG tablet Take 1 tablet by mouth as needed.    [name] EPINEPHrine 0.3 mg/0.3 mL IJ SOAJ injection INJ UTD PRN    . ezetimibe (ZETIA) 10 MG tablet Take 1 tablet (10 mg total) by mouth daily. 90 tablet 3  . Fexofenadine HCl (ALLEGRA PO) Take 1 tablet by mouth daily.    . fluticasone (FLONASE) 50 MCG/ACT nasal spray Place 1 spray into both nostrils daily.    . Naproxen Sodium (ALEVE) 220 MG CAPS Take 1 capsule by mouth as needed.    [name] REPATHA SURECLICK [id number] MG/ML SOAJ Inject 1 pen into the skin every 14 (fourteen) days. 2 pen 11  . testosterone cypionate (DEPOTESTOSTERONE CYPIONATE) 200 MG/ML injection Inject 0.3 mLs into the muscle once a week.     [name] UNABLE TO FIND once a week. Med Name:Allergy shot     No current  facility-administered medications for this visit.    Allergies:   Niaspan [niacin er] and Simvastatin    Social History:  The patient  reports that he has never smoked. He has never used smokeless tobacco. He reports that he does not drink alcohol or use drugs.   Family History:  The patient's family history  includes Diabetes in his mother; Heart failure in his mother; Hyperlipidemia in his mother; Liver disease in his father; Transient ischemic attack in his mother.    ROS: as per current hx . Otherwise negativ e  Physical Exam: Blood pressure 122/80, pulse 78, height 5' 8.5" (1.74 m), weight 142 lb 12.8 oz (64.8 kg), SpO2 98 %.  GEN:  Well nourished, well developed in no acute distress HEENT: Normal NECK: No JVD; No carotid bruits LYMPHATICS: No lymphadenopathy CARDIAC: RRR , no murmurs, rubs, gallops RESPIRATORY:  Clear to auscultation without rales, wheezing or rhonchi  ABDOMEN: Soft, non-tender, non-distended MUSCULOSKELETAL:  No edema; No deformity  SKIN: Warm and dry NEUROLOGIC:  Alert and oriented x 3   EKG:  . Recent Labs: 07/31/2019: ALT 17; BUN 18; Creatinine, Ser 1.16; Potassium 4.6; Sodium 140    Lipid Panel    Component Value Date/Time   CHOL 146 07/31/2019 1435   TRIG 66 07/31/2019 1435   HDL 70 07/31/2019 1435   CHOLHDL 2.1 07/31/2019 1435   CHOLHDL 2.5 11/17/2015 0951   VLDL 11 11/17/2015 0951   LDLCALC 63 07/31/2019 1435      Wt Readings from Last 3 Encounters:  01/09/20 142 lb 12.8 oz (64.8 kg)  07/31/19 143 lb 12.8 oz (65.2 kg)  02/12/19 143 lb (64.9 kg)      Other studies Reviewed: Additional studies/ records that were reviewed today include: . Review of the above records demonstrates:    ASSESSMENT AND PLAN:  1. Coronary artery disease-    he denies any angina.  He still runs on a regular basis.  2. Hyperlipidemia-lipid levels have been good.  He is on Zetia and Repatha.  3.  Near syncope -    his episodes of near syncope seem to  be related to orthostatic hypotension.  He is not had any further episodes of orthostasis since increasing his intake of V8 juice and extra protein in the form of hard-boiled eggs.    Current medicines are reviewed at length with the patient today.  The patient has concerns regarding medicines.  The following changes have been made:  no change  Labs/ tests ordered today include:   No orders of the defined types were placed in this encounter.   Disposition:   FU with me in 1 year.    Signed, Mertie Moores, MD  01/09/2020 9:52 AM    [facility] [address], [place], [place]  [address] Phone: [phone]; Fax: [phone]

## 2020-01-12 DIAGNOSIS — J3089 Other allergic rhinitis: Secondary | ICD-10-CM | POA: Diagnosis not present

## 2020-01-12 DIAGNOSIS — J301 Allergic rhinitis due to pollen: Secondary | ICD-10-CM | POA: Diagnosis not present

## 2020-01-12 DIAGNOSIS — J3081 Allergic rhinitis due to animal (cat) (dog) hair and dander: Secondary | ICD-10-CM | POA: Diagnosis not present

## 2020-01-13 DIAGNOSIS — J3089 Other allergic rhinitis: Secondary | ICD-10-CM | POA: Diagnosis not present

## 2020-01-13 DIAGNOSIS — H1045 Other chronic allergic conjunctivitis: Secondary | ICD-10-CM | POA: Diagnosis not present

## 2020-01-13 DIAGNOSIS — J3081 Allergic rhinitis due to animal (cat) (dog) hair and dander: Secondary | ICD-10-CM | POA: Diagnosis not present

## 2020-01-13 DIAGNOSIS — J301 Allergic rhinitis due to pollen: Secondary | ICD-10-CM | POA: Diagnosis not present

## 2020-01-15 DIAGNOSIS — J3089 Other allergic rhinitis: Secondary | ICD-10-CM | POA: Diagnosis not present

## 2020-01-15 DIAGNOSIS — J301 Allergic rhinitis due to pollen: Secondary | ICD-10-CM | POA: Diagnosis not present

## 2020-01-15 DIAGNOSIS — J3081 Allergic rhinitis due to animal (cat) (dog) hair and dander: Secondary | ICD-10-CM | POA: Diagnosis not present

## 2020-01-19 DIAGNOSIS — J3089 Other allergic rhinitis: Secondary | ICD-10-CM | POA: Diagnosis not present

## 2020-01-19 DIAGNOSIS — J3081 Allergic rhinitis due to animal (cat) (dog) hair and dander: Secondary | ICD-10-CM | POA: Diagnosis not present

## 2020-01-19 DIAGNOSIS — J301 Allergic rhinitis due to pollen: Secondary | ICD-10-CM | POA: Diagnosis not present

## 2020-02-02 DIAGNOSIS — J3089 Other allergic rhinitis: Secondary | ICD-10-CM | POA: Diagnosis not present

## 2020-02-02 DIAGNOSIS — J3081 Allergic rhinitis due to animal (cat) (dog) hair and dander: Secondary | ICD-10-CM | POA: Diagnosis not present

## 2020-02-02 DIAGNOSIS — J301 Allergic rhinitis due to pollen: Secondary | ICD-10-CM | POA: Diagnosis not present

## 2020-02-09 DIAGNOSIS — J3089 Other allergic rhinitis: Secondary | ICD-10-CM | POA: Diagnosis not present

## 2020-02-09 DIAGNOSIS — J301 Allergic rhinitis due to pollen: Secondary | ICD-10-CM | POA: Diagnosis not present

## 2020-02-09 DIAGNOSIS — J3081 Allergic rhinitis due to animal (cat) (dog) hair and dander: Secondary | ICD-10-CM | POA: Diagnosis not present

## 2020-02-19 ENCOUNTER — Other Ambulatory Visit: Payer: Self-pay

## 2020-02-19 DIAGNOSIS — J301 Allergic rhinitis due to pollen: Secondary | ICD-10-CM | POA: Diagnosis not present

## 2020-02-19 DIAGNOSIS — J3089 Other allergic rhinitis: Secondary | ICD-10-CM | POA: Diagnosis not present

## 2020-02-19 DIAGNOSIS — J3081 Allergic rhinitis due to animal (cat) (dog) hair and dander: Secondary | ICD-10-CM | POA: Diagnosis not present

## 2020-02-19 MED ORDER — EZETIMIBE 10 MG PO TABS: 10.0000 mg | ORAL_TABLET | Freq: Every day | ORAL | 3 refills | Status: DC

## 2020-02-24 DIAGNOSIS — J3081 Allergic rhinitis due to animal (cat) (dog) hair and dander: Secondary | ICD-10-CM | POA: Diagnosis not present

## 2020-02-24 DIAGNOSIS — J301 Allergic rhinitis due to pollen: Secondary | ICD-10-CM | POA: Diagnosis not present

## 2020-02-24 DIAGNOSIS — J3089 Other allergic rhinitis: Secondary | ICD-10-CM | POA: Diagnosis not present

## 2020-03-04 DIAGNOSIS — J3089 Other allergic rhinitis: Secondary | ICD-10-CM | POA: Diagnosis not present

## 2020-03-04 DIAGNOSIS — J301 Allergic rhinitis due to pollen: Secondary | ICD-10-CM | POA: Diagnosis not present

## 2020-03-04 DIAGNOSIS — J3081 Allergic rhinitis due to animal (cat) (dog) hair and dander: Secondary | ICD-10-CM | POA: Diagnosis not present

## 2020-03-18 DIAGNOSIS — J301 Allergic rhinitis due to pollen: Secondary | ICD-10-CM | POA: Diagnosis not present

## 2020-03-18 DIAGNOSIS — J3089 Other allergic rhinitis: Secondary | ICD-10-CM | POA: Diagnosis not present

## 2020-03-18 DIAGNOSIS — J3081 Allergic rhinitis due to animal (cat) (dog) hair and dander: Secondary | ICD-10-CM | POA: Diagnosis not present

## 2020-03-23 DIAGNOSIS — J301 Allergic rhinitis due to pollen: Secondary | ICD-10-CM | POA: Diagnosis not present

## 2020-03-23 DIAGNOSIS — J3081 Allergic rhinitis due to animal (cat) (dog) hair and dander: Secondary | ICD-10-CM | POA: Diagnosis not present

## 2020-03-23 DIAGNOSIS — J3089 Other allergic rhinitis: Secondary | ICD-10-CM | POA: Diagnosis not present

## 2020-04-01 DIAGNOSIS — J3081 Allergic rhinitis due to animal (cat) (dog) hair and dander: Secondary | ICD-10-CM | POA: Diagnosis not present

## 2020-04-01 DIAGNOSIS — J3089 Other allergic rhinitis: Secondary | ICD-10-CM | POA: Diagnosis not present

## 2020-04-01 DIAGNOSIS — J301 Allergic rhinitis due to pollen: Secondary | ICD-10-CM | POA: Diagnosis not present

## 2020-04-08 DIAGNOSIS — J3089 Other allergic rhinitis: Secondary | ICD-10-CM | POA: Diagnosis not present

## 2020-04-08 DIAGNOSIS — J301 Allergic rhinitis due to pollen: Secondary | ICD-10-CM | POA: Diagnosis not present

## 2020-04-08 DIAGNOSIS — J3081 Allergic rhinitis due to animal (cat) (dog) hair and dander: Secondary | ICD-10-CM | POA: Diagnosis not present

## 2020-04-15 DIAGNOSIS — J3081 Allergic rhinitis due to animal (cat) (dog) hair and dander: Secondary | ICD-10-CM | POA: Diagnosis not present

## 2020-04-15 DIAGNOSIS — J301 Allergic rhinitis due to pollen: Secondary | ICD-10-CM | POA: Diagnosis not present

## 2020-04-15 DIAGNOSIS — J3089 Other allergic rhinitis: Secondary | ICD-10-CM | POA: Diagnosis not present

## 2020-04-21 DIAGNOSIS — J3081 Allergic rhinitis due to animal (cat) (dog) hair and dander: Secondary | ICD-10-CM | POA: Diagnosis not present

## 2020-04-21 DIAGNOSIS — J301 Allergic rhinitis due to pollen: Secondary | ICD-10-CM | POA: Diagnosis not present

## 2020-04-21 DIAGNOSIS — J3089 Other allergic rhinitis: Secondary | ICD-10-CM | POA: Diagnosis not present

## 2020-04-23 DIAGNOSIS — J3089 Other allergic rhinitis: Secondary | ICD-10-CM | POA: Diagnosis not present

## 2020-04-23 DIAGNOSIS — J3081 Allergic rhinitis due to animal (cat) (dog) hair and dander: Secondary | ICD-10-CM | POA: Diagnosis not present

## 2020-04-23 DIAGNOSIS — J301 Allergic rhinitis due to pollen: Secondary | ICD-10-CM | POA: Diagnosis not present

## 2020-04-28 DIAGNOSIS — J3089 Other allergic rhinitis: Secondary | ICD-10-CM | POA: Diagnosis not present

## 2020-04-28 DIAGNOSIS — J301 Allergic rhinitis due to pollen: Secondary | ICD-10-CM | POA: Diagnosis not present

## 2020-04-28 DIAGNOSIS — J3081 Allergic rhinitis due to animal (cat) (dog) hair and dander: Secondary | ICD-10-CM | POA: Diagnosis not present

## 2020-05-07 DIAGNOSIS — J301 Allergic rhinitis due to pollen: Secondary | ICD-10-CM | POA: Diagnosis not present

## 2020-05-07 DIAGNOSIS — J3081 Allergic rhinitis due to animal (cat) (dog) hair and dander: Secondary | ICD-10-CM | POA: Diagnosis not present

## 2020-05-07 DIAGNOSIS — J3089 Other allergic rhinitis: Secondary | ICD-10-CM | POA: Diagnosis not present

## 2020-05-12 DIAGNOSIS — J301 Allergic rhinitis due to pollen: Secondary | ICD-10-CM | POA: Diagnosis not present

## 2020-05-12 DIAGNOSIS — J3089 Other allergic rhinitis: Secondary | ICD-10-CM | POA: Diagnosis not present

## 2020-05-12 DIAGNOSIS — J3081 Allergic rhinitis due to animal (cat) (dog) hair and dander: Secondary | ICD-10-CM | POA: Diagnosis not present

## 2020-05-14 DIAGNOSIS — K219 Gastro-esophageal reflux disease without esophagitis: Secondary | ICD-10-CM | POA: Diagnosis not present

## 2020-05-14 DIAGNOSIS — J301 Allergic rhinitis due to pollen: Secondary | ICD-10-CM | POA: Diagnosis not present

## 2020-05-14 DIAGNOSIS — Z0000 Encounter for general adult medical examination without abnormal findings: Secondary | ICD-10-CM | POA: Diagnosis not present

## 2020-05-14 DIAGNOSIS — E291 Testicular hypofunction: Secondary | ICD-10-CM | POA: Diagnosis not present

## 2020-05-14 DIAGNOSIS — I2510 Atherosclerotic heart disease of native coronary artery without angina pectoris: Secondary | ICD-10-CM | POA: Diagnosis not present

## 2020-05-14 DIAGNOSIS — E7800 Pure hypercholesterolemia, unspecified: Secondary | ICD-10-CM | POA: Diagnosis not present

## 2020-05-14 DIAGNOSIS — E559 Vitamin D deficiency, unspecified: Secondary | ICD-10-CM | POA: Diagnosis not present

## 2020-05-14 DIAGNOSIS — I498 Other specified cardiac arrhythmias: Secondary | ICD-10-CM | POA: Diagnosis not present

## 2020-05-14 DIAGNOSIS — G479 Sleep disorder, unspecified: Secondary | ICD-10-CM | POA: Diagnosis not present

## 2020-05-14 DIAGNOSIS — N529 Male erectile dysfunction, unspecified: Secondary | ICD-10-CM | POA: Diagnosis not present

## 2020-05-15 DIAGNOSIS — I498 Other specified cardiac arrhythmias: Secondary | ICD-10-CM | POA: Diagnosis not present

## 2020-05-20 DIAGNOSIS — J3081 Allergic rhinitis due to animal (cat) (dog) hair and dander: Secondary | ICD-10-CM | POA: Diagnosis not present

## 2020-05-20 DIAGNOSIS — J3089 Other allergic rhinitis: Secondary | ICD-10-CM | POA: Diagnosis not present

## 2020-05-20 DIAGNOSIS — J301 Allergic rhinitis due to pollen: Secondary | ICD-10-CM | POA: Diagnosis not present

## 2020-05-25 DIAGNOSIS — J3081 Allergic rhinitis due to animal (cat) (dog) hair and dander: Secondary | ICD-10-CM | POA: Diagnosis not present

## 2020-05-25 DIAGNOSIS — J301 Allergic rhinitis due to pollen: Secondary | ICD-10-CM | POA: Diagnosis not present

## 2020-05-25 DIAGNOSIS — J3089 Other allergic rhinitis: Secondary | ICD-10-CM | POA: Diagnosis not present

## 2020-05-31 DIAGNOSIS — J3089 Other allergic rhinitis: Secondary | ICD-10-CM | POA: Diagnosis not present

## 2020-05-31 DIAGNOSIS — J3081 Allergic rhinitis due to animal (cat) (dog) hair and dander: Secondary | ICD-10-CM | POA: Diagnosis not present

## 2020-05-31 DIAGNOSIS — J301 Allergic rhinitis due to pollen: Secondary | ICD-10-CM | POA: Diagnosis not present

## 2020-06-09 DIAGNOSIS — J301 Allergic rhinitis due to pollen: Secondary | ICD-10-CM | POA: Diagnosis not present

## 2020-06-09 DIAGNOSIS — J3089 Other allergic rhinitis: Secondary | ICD-10-CM | POA: Diagnosis not present

## 2020-06-09 DIAGNOSIS — J3081 Allergic rhinitis due to animal (cat) (dog) hair and dander: Secondary | ICD-10-CM | POA: Diagnosis not present

## 2020-06-15 DIAGNOSIS — J301 Allergic rhinitis due to pollen: Secondary | ICD-10-CM | POA: Diagnosis not present

## 2020-06-15 DIAGNOSIS — J3081 Allergic rhinitis due to animal (cat) (dog) hair and dander: Secondary | ICD-10-CM | POA: Diagnosis not present

## 2020-06-15 DIAGNOSIS — J3089 Other allergic rhinitis: Secondary | ICD-10-CM | POA: Diagnosis not present

## 2020-06-23 DIAGNOSIS — J301 Allergic rhinitis due to pollen: Secondary | ICD-10-CM | POA: Diagnosis not present

## 2020-06-23 DIAGNOSIS — J3089 Other allergic rhinitis: Secondary | ICD-10-CM | POA: Diagnosis not present

## 2020-06-23 DIAGNOSIS — J3081 Allergic rhinitis due to animal (cat) (dog) hair and dander: Secondary | ICD-10-CM | POA: Diagnosis not present

## 2020-07-01 DIAGNOSIS — J301 Allergic rhinitis due to pollen: Secondary | ICD-10-CM | POA: Diagnosis not present

## 2020-07-01 DIAGNOSIS — J3089 Other allergic rhinitis: Secondary | ICD-10-CM | POA: Diagnosis not present

## 2020-07-01 DIAGNOSIS — J3081 Allergic rhinitis due to animal (cat) (dog) hair and dander: Secondary | ICD-10-CM | POA: Diagnosis not present

## 2020-07-09 DIAGNOSIS — J301 Allergic rhinitis due to pollen: Secondary | ICD-10-CM | POA: Diagnosis not present

## 2020-07-09 DIAGNOSIS — J3089 Other allergic rhinitis: Secondary | ICD-10-CM | POA: Diagnosis not present

## 2020-07-09 DIAGNOSIS — J3081 Allergic rhinitis due to animal (cat) (dog) hair and dander: Secondary | ICD-10-CM | POA: Diagnosis not present

## 2020-07-13 ENCOUNTER — Encounter: Payer: Self-pay | Admitting: Cardiovascular Disease

## 2020-07-13 ENCOUNTER — Other Ambulatory Visit: Payer: Self-pay

## 2020-07-13 ENCOUNTER — Ambulatory Visit: Payer: PPO | Admitting: Cardiovascular Disease

## 2020-07-13 VITALS — BP 114/72 | HR 76 | Ht 68.5 in | Wt 142.4 lb

## 2020-07-13 DIAGNOSIS — E782 Mixed hyperlipidemia: Secondary | ICD-10-CM

## 2020-07-13 DIAGNOSIS — I2510 Atherosclerotic heart disease of native coronary artery without angina pectoris: Secondary | ICD-10-CM | POA: Diagnosis not present

## 2020-07-13 NOTE — Progress Notes (Signed)
Cardiology Office Note   Date:  07/13/2020   ID:  William Bernard, DOB 07/31/1946, MRN [id number]  PCP:  Karlene Einstein, MD  Cardiologist:   Mertie Moores, MD   No chief complaint on file.  Problem list: 1. Coronary artery disease 2. Hyperlipidemia William Bernard is a 74 year old gentleman with a history of coronary artery disease. Status post PTCA and stenting of his left anterior descending artery 2005. He's status post cutting balloon atherectomy to the stent in November of 2005. He also has a history of hypercholesterolemia and elevated CPK levels. He's had elevated liver enzymes with multiple statins. He's been able to tolerate Lipitor 40 mg a day without too much difficulty.  He has retired since I last saw him. He still works out on a regular basis - works out at [employer] regularly. He has cramping in his calves when he runs. He runs on trails which causes him to run on his toes a lot. He thinks that this may be the cause of his calf cramps. He denies any chest pain with exercise.  February 06, 2013:  William Bernard is doing well. He still has lots of muscle cramps associated with the statin.   February 16, 2014:  William Bernard is doing well. 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 Still runs,   Oct. 26, 2015:  William Bernard is doing well. Usual aches and pains.  Still exercising.  He cut his atorvastatin to 20 mg last year ( at the recommendation of [employer]). Also started taking Vit D Also taking Zetia   January 15, 2015:   William Bernard is a 74 y.o. male who presents for follow up of his CAD and thanksgiving. Still having lots of leg cramps - presumably due to statin.  Not running as much. Spinning more  Jan. 18, 2017:  Doing well.  Scheduled for blood work  Is on Pralulent and LDL is now 99.   Is being seen in Lipid clinic. Does not have leg / muscle aching since being off the statin . Does not want to restart any statin  No angina.   Very active.  Still exercising regularly   -runs  regularly ,   Jan. 23, 2018;  Doing well. No episodes of chest pain or shortness breath. He is still exercising on a regular basis. Has several questions Is on Pralulent.   And also is on Zetia.   Last LDL is 86 ([facility])   LDL - 86 Chol - 177 Trigs - 64 HDL - 77 Non - HDL 100  Takes Advil for muscle soreness when he lifts weights.  Questions about his wife,   A bit overweight.    Feb. 8, 2019:  Doing well,   Is cycling instead of running ( has plantar fascitis)  Notices that he is slowing down.  Doesn't recover as fast when he reaches his aneorobic threshold.    July 31, 2019: William Bernard is seen today for follow-up visit he has been having some episodes of near syncope recently.  For the past weeks, typically on Wed. AM While reading  Has pressure in his head,  Gets nauseous, gets a taste in his mouth, feeling goes down to stomach. Last 3-4 seconds Will occur once and not again    Still running ,  Runs 4 miles a day , 2-3 times a week and 7 miles on the weekend. Lifting light weights.   Takes allergy shots on Mondays and Tuesdays.  Takes testosterone shots on Monday s  Eating regularly , cereal and banana for breakfast   Does not take his BP   January 09, 2020:  William Bernard is seen today for follow-up visit.  He is a very active gentleman is been an avid runner all of his life.  He he has a history of coronary artery disease and hyperlipidemia. No CP , no dyspnea.   Got his first covid vaccine yesterday  No further episodes of presyncope. - he is better after getting more protein and salt.    Sept. 14, 2021:  William Bernard is doing well.  Has had 2 separate episodes of CP while running   No further episodes.  Is running as far as 7 miles a day at times.   Labs from primary MD  LDL is 68 Total cholesterol is 144 Triglyceride level 65 HDL is 69 Non-HDL cholesterol is 75      Past Medical History:  Diagnosis Date  . Coronary artery disease    post PTCA and stenting  of his LAD in 2005, he is statuts post Cutting Balloon atherectomy of the stent in November 2005    . Elevated CPK   . Elevated liver enzymes    due to multiple statin medication  . Hyperlipidemia     Past Surgical History:  Procedure Laterality Date  . CARDIAC CATHETERIZATION  09/23/2004   Subacute thrombosis of the previous long Taxus stent to the mid LAD with residual thrombus -- Residual disease involving the ostium of the first diagonal branch -- Ulcerated plaque involving the right coronary artery.  . CORONARY ANGIOPLASTY  09/26/2004    Successful PTCA and cutting balloon of the proximal left anterior descending artery --  Partially successful angioplasty involving the first diagonal vessel We will continue with Integrilin, Plavix.  He will likely have to take Plavix lifelong  . CORONARY ANGIOPLASTY WITH STENT PLACEMENT  12/25/2003   Est. EF of 50%. -- stenting of LAD -- Single vessel coronary artery disease involving the LAD and diagonal system   . EYE SURGERY    . KNEE SURGERY    . TONSILLECTOMY    . VASECTOMY       Current Outpatient Medications  Medication Sig Dispense Refill  . aspirin 81 MG tablet Take 81 mg by mouth daily.      [name] azelastine (OPTIVAR) 0.05 % ophthalmic solution Place 2 drops into both eyes 2 (two) times daily.    . Cholecalciferol (VITAMIN D) 2000 UNITS CAPS Take 1 capsule (2,000 Units total) by mouth daily. 30 capsule   . clopidogrel (PLAVIX) 75 MG tablet Take 75 mg by mouth daily.      [name] EPINEPHrine 0.3 mg/0.3 mL IJ SOAJ injection INJ UTD PRN    . ezetimibe (ZETIA) 10 MG tablet Take 1 tablet (10 mg total) by mouth daily. 90 tablet 3  . Fexofenadine HCl (ALLEGRA PO) Take 1 tablet by mouth daily.    . fluticasone (FLONASE) 50 MCG/ACT nasal spray Place 1 spray into both nostrils daily.    . Naproxen Sodium (ALEVE) 220 MG CAPS Take 1 capsule by mouth as needed.    [name] REPATHA SURECLICK 366 MG/ML SOAJ Inject 1 pen into the skin every 14 (fourteen) days. 2 pen  11  . testosterone cypionate (DEPOTESTOSTERONE CYPIONATE) 200 MG/ML injection Inject 0.25 mLs into the muscle once a week.     [name] UNABLE TO FIND once a week. Med Name:Allergy shot    . zinc gluconate 50 MG tablet Take 1 tablet by mouth daily.  No current facility-administered medications for this visit.    Allergies:   Niaspan [niacin er] and Simvastatin    Social History:  The patient  reports that he has never smoked. He has never used smokeless tobacco. He reports that he does not drink alcohol and does not use drugs.   Family History:  The patient's family history includes Diabetes in his mother; Heart failure in his mother; Hyperlipidemia in his mother; Liver disease in his father; Transient ischemic attack in his mother.    ROS: as per current hx . Otherwise negativ e  Physical Exam: Blood pressure 114/72, pulse 76, height 5' 8.5" (1.74 m), weight 142 lb 6.4 oz (64.6 kg), SpO2 98 %.  GEN:  Well nourished, well developed in no acute distress HEENT: Normal NECK: No JVD; No carotid bruits LYMPHATICS: No lymphadenopathy CARDIAC: RRR , no murmurs, rubs, gallops RESPIRATORY:  Clear to auscultation without rales, wheezing or rhonchi  ABDOMEN: Soft, non-tender, non-distended MUSCULOSKELETAL:  No edema; No deformity  SKIN: Warm and dry NEUROLOGIC:  Alert and oriented x 3    EKG:   Sept. 14, 2021:   NS at 76 PACs . Recent Labs: 01/09/2020: ALT 15; BUN 20; Creatinine, Ser 0.97; Potassium 4.0; Sodium 141    Lipid Panel    Component Value Date/Time   CHOL 153 01/09/2020 0819   TRIG 63 01/09/2020 0819   HDL 71 01/09/2020 0819   CHOLHDL 2.2 01/09/2020 0819   CHOLHDL 2.5 11/17/2015 0951   VLDL 11 11/17/2015 0951   LDLCALC 69 01/09/2020 0819      Wt Readings from Last 3 Encounters:  07/13/20 142 lb 6.4 oz (64.6 kg)  01/09/20 142 lb 12.8 oz (64.8 kg)  07/31/19 143 lb 12.8 oz (65.2 kg)      Other studies Reviewed: Additional studies/ records that were reviewed today  include: . Review of the above records demonstrates:    ASSESSMENT AND PLAN:  1. Coronary artery disease-    Rare episodes of CP.  Mid sternal.  The cp resolved quickly and has not recurred despite running at a fast pace   He will pay attention to his CP   2. Hyperlipidemia-lipid levels have been good.  Cont zetia and repatha . Labs from primary look great   3.  Near syncope -    Better with V-8.     Current medicines are reviewed at length with the patient today.  The patient has concerns regarding medicines.  The following changes have been made:  no change  Labs/ tests ordered today include:   No orders of the defined types were placed in this encounter.   Disposition:      Signed, Mertie Moores, MD  07/13/2020 8:12 AM    [facility] [place], [place], [place]  [address] Phone: [phone]; Fax: [phone]

## 2020-07-13 NOTE — Patient Instructions (Signed)

## 2020-07-14 DIAGNOSIS — J3089 Other allergic rhinitis: Secondary | ICD-10-CM | POA: Diagnosis not present

## 2020-07-14 DIAGNOSIS — J301 Allergic rhinitis due to pollen: Secondary | ICD-10-CM | POA: Diagnosis not present

## 2020-07-14 DIAGNOSIS — J3081 Allergic rhinitis due to animal (cat) (dog) hair and dander: Secondary | ICD-10-CM | POA: Diagnosis not present

## 2020-07-22 DIAGNOSIS — J3089 Other allergic rhinitis: Secondary | ICD-10-CM | POA: Diagnosis not present

## 2020-07-22 DIAGNOSIS — J301 Allergic rhinitis due to pollen: Secondary | ICD-10-CM | POA: Diagnosis not present

## 2020-07-22 DIAGNOSIS — J3081 Allergic rhinitis due to animal (cat) (dog) hair and dander: Secondary | ICD-10-CM | POA: Diagnosis not present

## 2020-07-28 DIAGNOSIS — J301 Allergic rhinitis due to pollen: Secondary | ICD-10-CM | POA: Diagnosis not present

## 2020-07-28 DIAGNOSIS — J3089 Other allergic rhinitis: Secondary | ICD-10-CM | POA: Diagnosis not present

## 2020-07-28 DIAGNOSIS — J3081 Allergic rhinitis due to animal (cat) (dog) hair and dander: Secondary | ICD-10-CM | POA: Diagnosis not present

## 2020-08-06 DIAGNOSIS — J301 Allergic rhinitis due to pollen: Secondary | ICD-10-CM | POA: Diagnosis not present

## 2020-08-06 DIAGNOSIS — J3089 Other allergic rhinitis: Secondary | ICD-10-CM | POA: Diagnosis not present

## 2020-08-06 DIAGNOSIS — J3081 Allergic rhinitis due to animal (cat) (dog) hair and dander: Secondary | ICD-10-CM | POA: Diagnosis not present

## 2020-08-16 ENCOUNTER — Other Ambulatory Visit: Payer: Self-pay | Admitting: Pharmacist

## 2020-08-16 ENCOUNTER — Other Ambulatory Visit: Payer: Self-pay | Admitting: Cardiovascular Disease

## 2020-08-16 DIAGNOSIS — J3089 Other allergic rhinitis: Secondary | ICD-10-CM | POA: Diagnosis not present

## 2020-08-16 DIAGNOSIS — J301 Allergic rhinitis due to pollen: Secondary | ICD-10-CM | POA: Diagnosis not present

## 2020-08-16 DIAGNOSIS — J3081 Allergic rhinitis due to animal (cat) (dog) hair and dander: Secondary | ICD-10-CM | POA: Diagnosis not present

## 2020-08-16 MED ORDER — REPATHA SURECLICK 140 MG/ML ~~LOC~~ SOAJ: 1.0000 "pen " | SUBCUTANEOUS | 11 refills | Status: DC

## 2020-08-24 DIAGNOSIS — J301 Allergic rhinitis due to pollen: Secondary | ICD-10-CM | POA: Diagnosis not present

## 2020-08-24 DIAGNOSIS — J3089 Other allergic rhinitis: Secondary | ICD-10-CM | POA: Diagnosis not present

## 2020-08-24 DIAGNOSIS — J3081 Allergic rhinitis due to animal (cat) (dog) hair and dander: Secondary | ICD-10-CM | POA: Diagnosis not present

## 2020-08-30 DIAGNOSIS — J3081 Allergic rhinitis due to animal (cat) (dog) hair and dander: Secondary | ICD-10-CM | POA: Diagnosis not present

## 2020-08-30 DIAGNOSIS — J301 Allergic rhinitis due to pollen: Secondary | ICD-10-CM | POA: Diagnosis not present

## 2020-08-30 DIAGNOSIS — J3089 Other allergic rhinitis: Secondary | ICD-10-CM | POA: Diagnosis not present

## 2020-09-06 DIAGNOSIS — J3081 Allergic rhinitis due to animal (cat) (dog) hair and dander: Secondary | ICD-10-CM | POA: Diagnosis not present

## 2020-09-06 DIAGNOSIS — J301 Allergic rhinitis due to pollen: Secondary | ICD-10-CM | POA: Diagnosis not present

## 2020-09-06 DIAGNOSIS — J3089 Other allergic rhinitis: Secondary | ICD-10-CM | POA: Diagnosis not present

## 2020-09-16 DIAGNOSIS — J3081 Allergic rhinitis due to animal (cat) (dog) hair and dander: Secondary | ICD-10-CM | POA: Diagnosis not present

## 2020-09-16 DIAGNOSIS — J3089 Other allergic rhinitis: Secondary | ICD-10-CM | POA: Diagnosis not present

## 2020-09-16 DIAGNOSIS — J301 Allergic rhinitis due to pollen: Secondary | ICD-10-CM | POA: Diagnosis not present

## 2020-09-20 DIAGNOSIS — J301 Allergic rhinitis due to pollen: Secondary | ICD-10-CM | POA: Diagnosis not present

## 2020-09-20 DIAGNOSIS — J3089 Other allergic rhinitis: Secondary | ICD-10-CM | POA: Diagnosis not present

## 2020-09-20 DIAGNOSIS — J3081 Allergic rhinitis due to animal (cat) (dog) hair and dander: Secondary | ICD-10-CM | POA: Diagnosis not present

## 2020-09-30 DIAGNOSIS — J3081 Allergic rhinitis due to animal (cat) (dog) hair and dander: Secondary | ICD-10-CM | POA: Diagnosis not present

## 2020-09-30 DIAGNOSIS — J301 Allergic rhinitis due to pollen: Secondary | ICD-10-CM | POA: Diagnosis not present

## 2020-09-30 DIAGNOSIS — J3089 Other allergic rhinitis: Secondary | ICD-10-CM | POA: Diagnosis not present

## 2020-10-05 DIAGNOSIS — J3081 Allergic rhinitis due to animal (cat) (dog) hair and dander: Secondary | ICD-10-CM | POA: Diagnosis not present

## 2020-10-05 DIAGNOSIS — J301 Allergic rhinitis due to pollen: Secondary | ICD-10-CM | POA: Diagnosis not present

## 2020-10-05 DIAGNOSIS — J3089 Other allergic rhinitis: Secondary | ICD-10-CM | POA: Diagnosis not present

## 2020-10-11 DIAGNOSIS — J301 Allergic rhinitis due to pollen: Secondary | ICD-10-CM | POA: Diagnosis not present

## 2020-10-11 DIAGNOSIS — J3081 Allergic rhinitis due to animal (cat) (dog) hair and dander: Secondary | ICD-10-CM | POA: Diagnosis not present

## 2020-10-11 DIAGNOSIS — J3089 Other allergic rhinitis: Secondary | ICD-10-CM | POA: Diagnosis not present

## 2020-10-14 DIAGNOSIS — J301 Allergic rhinitis due to pollen: Secondary | ICD-10-CM | POA: Diagnosis not present

## 2020-10-14 DIAGNOSIS — J3089 Other allergic rhinitis: Secondary | ICD-10-CM | POA: Diagnosis not present

## 2020-10-14 DIAGNOSIS — J3081 Allergic rhinitis due to animal (cat) (dog) hair and dander: Secondary | ICD-10-CM | POA: Diagnosis not present

## 2020-10-27 DIAGNOSIS — J3081 Allergic rhinitis due to animal (cat) (dog) hair and dander: Secondary | ICD-10-CM | POA: Diagnosis not present

## 2020-10-27 DIAGNOSIS — J301 Allergic rhinitis due to pollen: Secondary | ICD-10-CM | POA: Diagnosis not present

## 2020-10-27 DIAGNOSIS — J3089 Other allergic rhinitis: Secondary | ICD-10-CM | POA: Diagnosis not present

## 2020-11-02 DIAGNOSIS — J301 Allergic rhinitis due to pollen: Secondary | ICD-10-CM | POA: Diagnosis not present

## 2020-11-02 DIAGNOSIS — J3081 Allergic rhinitis due to animal (cat) (dog) hair and dander: Secondary | ICD-10-CM | POA: Diagnosis not present

## 2020-11-02 DIAGNOSIS — J3089 Other allergic rhinitis: Secondary | ICD-10-CM | POA: Diagnosis not present

## 2020-11-04 DIAGNOSIS — J3081 Allergic rhinitis due to animal (cat) (dog) hair and dander: Secondary | ICD-10-CM | POA: Diagnosis not present

## 2020-11-04 DIAGNOSIS — J301 Allergic rhinitis due to pollen: Secondary | ICD-10-CM | POA: Diagnosis not present

## 2020-11-04 DIAGNOSIS — J3089 Other allergic rhinitis: Secondary | ICD-10-CM | POA: Diagnosis not present

## 2020-11-08 ENCOUNTER — Ambulatory Visit: Payer: PPO | Attending: Internal Medicine

## 2020-11-08 ENCOUNTER — Other Ambulatory Visit (HOSPITAL_BASED_OUTPATIENT_CLINIC_OR_DEPARTMENT_OTHER): Payer: Self-pay | Admitting: Internal Medicine

## 2020-11-08 DIAGNOSIS — Z23 Encounter for immunization: Secondary | ICD-10-CM

## 2020-11-08 NOTE — Progress Notes (Signed)
   Covid-19 Vaccination Clinic  Name:  Rawley Harju    MRN: [id number] DOB: April 08, 1946  11/08/2020  Mr. Fanguy was observed post Covid-19 immunization for 15 minutes without incident. He was provided with Vaccine Information Sheet and instruction to access the V-Safe system.   Mr. Hammen was instructed to call 911 with any severe reactions post vaccine: [name] Difficulty breathing  . Swelling of face and throat  . A fast heartbeat  . A bad rash all over body  . Dizziness and weakness   Immunizations Administered    Name Date Dose VIS Date Route   Moderna Covid-19 Booster Vaccine 11/08/2020  9:28 AM 0.25 mL 08/18/2020 Intramuscular   Manufacturer: Levan Hurst   Lot: [id number]   [place]: [phone]

## 2020-11-09 MED FILL — MODERNA COVID-19 VACCINE 10: 100 | 28 days supply | Qty: 0 | Fill #0

## 2020-11-17 DIAGNOSIS — Z20828 Contact with and (suspected) exposure to other viral communicable diseases: Secondary | ICD-10-CM | POA: Diagnosis not present

## 2020-11-17 DIAGNOSIS — Z20822 Contact with and (suspected) exposure to covid-19: Secondary | ICD-10-CM | POA: Diagnosis not present

## 2020-11-29 DIAGNOSIS — J301 Allergic rhinitis due to pollen: Secondary | ICD-10-CM | POA: Diagnosis not present

## 2020-11-29 DIAGNOSIS — J3081 Allergic rhinitis due to animal (cat) (dog) hair and dander: Secondary | ICD-10-CM | POA: Diagnosis not present

## 2020-11-29 DIAGNOSIS — J3089 Other allergic rhinitis: Secondary | ICD-10-CM | POA: Diagnosis not present

## 2020-12-07 DIAGNOSIS — J3081 Allergic rhinitis due to animal (cat) (dog) hair and dander: Secondary | ICD-10-CM | POA: Diagnosis not present

## 2020-12-07 DIAGNOSIS — J3089 Other allergic rhinitis: Secondary | ICD-10-CM | POA: Diagnosis not present

## 2020-12-07 DIAGNOSIS — J301 Allergic rhinitis due to pollen: Secondary | ICD-10-CM | POA: Diagnosis not present

## 2021-01-03 DIAGNOSIS — J3089 Other allergic rhinitis: Secondary | ICD-10-CM | POA: Diagnosis not present

## 2021-01-03 DIAGNOSIS — J301 Allergic rhinitis due to pollen: Secondary | ICD-10-CM | POA: Diagnosis not present

## 2021-01-03 DIAGNOSIS — J3081 Allergic rhinitis due to animal (cat) (dog) hair and dander: Secondary | ICD-10-CM | POA: Diagnosis not present

## 2021-01-10 DIAGNOSIS — J301 Allergic rhinitis due to pollen: Secondary | ICD-10-CM | POA: Diagnosis not present

## 2021-01-10 DIAGNOSIS — J3081 Allergic rhinitis due to animal (cat) (dog) hair and dander: Secondary | ICD-10-CM | POA: Diagnosis not present

## 2021-01-10 DIAGNOSIS — J3089 Other allergic rhinitis: Secondary | ICD-10-CM | POA: Diagnosis not present

## 2021-01-14 DIAGNOSIS — J301 Allergic rhinitis due to pollen: Secondary | ICD-10-CM | POA: Diagnosis not present

## 2021-01-14 DIAGNOSIS — H1045 Other chronic allergic conjunctivitis: Secondary | ICD-10-CM | POA: Diagnosis not present

## 2021-01-14 DIAGNOSIS — J3081 Allergic rhinitis due to animal (cat) (dog) hair and dander: Secondary | ICD-10-CM | POA: Diagnosis not present

## 2021-01-14 DIAGNOSIS — J3089 Other allergic rhinitis: Secondary | ICD-10-CM | POA: Diagnosis not present

## 2021-01-21 DIAGNOSIS — J3089 Other allergic rhinitis: Secondary | ICD-10-CM | POA: Diagnosis not present

## 2021-01-21 DIAGNOSIS — J3081 Allergic rhinitis due to animal (cat) (dog) hair and dander: Secondary | ICD-10-CM | POA: Diagnosis not present

## 2021-01-21 DIAGNOSIS — J301 Allergic rhinitis due to pollen: Secondary | ICD-10-CM | POA: Diagnosis not present

## 2021-01-28 DIAGNOSIS — J3081 Allergic rhinitis due to animal (cat) (dog) hair and dander: Secondary | ICD-10-CM | POA: Diagnosis not present

## 2021-01-28 DIAGNOSIS — J301 Allergic rhinitis due to pollen: Secondary | ICD-10-CM | POA: Diagnosis not present

## 2021-01-28 DIAGNOSIS — J3089 Other allergic rhinitis: Secondary | ICD-10-CM | POA: Diagnosis not present

## 2021-02-02 DIAGNOSIS — J301 Allergic rhinitis due to pollen: Secondary | ICD-10-CM | POA: Diagnosis not present

## 2021-02-02 DIAGNOSIS — J3081 Allergic rhinitis due to animal (cat) (dog) hair and dander: Secondary | ICD-10-CM | POA: Diagnosis not present

## 2021-02-02 DIAGNOSIS — J3089 Other allergic rhinitis: Secondary | ICD-10-CM | POA: Diagnosis not present

## 2021-02-09 DIAGNOSIS — J301 Allergic rhinitis due to pollen: Secondary | ICD-10-CM | POA: Diagnosis not present

## 2021-02-09 DIAGNOSIS — J3081 Allergic rhinitis due to animal (cat) (dog) hair and dander: Secondary | ICD-10-CM | POA: Diagnosis not present

## 2021-02-09 DIAGNOSIS — J3089 Other allergic rhinitis: Secondary | ICD-10-CM | POA: Diagnosis not present

## 2021-02-17 DIAGNOSIS — J3089 Other allergic rhinitis: Secondary | ICD-10-CM | POA: Diagnosis not present

## 2021-02-17 DIAGNOSIS — J301 Allergic rhinitis due to pollen: Secondary | ICD-10-CM | POA: Diagnosis not present

## 2021-02-17 DIAGNOSIS — J3081 Allergic rhinitis due to animal (cat) (dog) hair and dander: Secondary | ICD-10-CM | POA: Diagnosis not present

## 2021-02-22 DIAGNOSIS — J3089 Other allergic rhinitis: Secondary | ICD-10-CM | POA: Diagnosis not present

## 2021-02-22 DIAGNOSIS — J3081 Allergic rhinitis due to animal (cat) (dog) hair and dander: Secondary | ICD-10-CM | POA: Diagnosis not present

## 2021-02-22 DIAGNOSIS — J301 Allergic rhinitis due to pollen: Secondary | ICD-10-CM | POA: Diagnosis not present

## 2021-02-24 DIAGNOSIS — M545 Low back pain, unspecified: Secondary | ICD-10-CM | POA: Diagnosis not present

## 2021-02-24 DIAGNOSIS — G8929 Other chronic pain: Secondary | ICD-10-CM | POA: Diagnosis not present

## 2021-02-28 DIAGNOSIS — J3081 Allergic rhinitis due to animal (cat) (dog) hair and dander: Secondary | ICD-10-CM | POA: Diagnosis not present

## 2021-02-28 DIAGNOSIS — J301 Allergic rhinitis due to pollen: Secondary | ICD-10-CM | POA: Diagnosis not present

## 2021-02-28 DIAGNOSIS — J3089 Other allergic rhinitis: Secondary | ICD-10-CM | POA: Diagnosis not present

## 2021-03-09 ENCOUNTER — Other Ambulatory Visit: Payer: Self-pay

## 2021-03-09 MED ORDER — EZETIMIBE 10 MG PO TABS: 10.0000 mg | ORAL_TABLET | Freq: Every day | ORAL | 1 refills | Status: DC

## 2021-03-11 DIAGNOSIS — J3081 Allergic rhinitis due to animal (cat) (dog) hair and dander: Secondary | ICD-10-CM | POA: Diagnosis not present

## 2021-03-11 DIAGNOSIS — J301 Allergic rhinitis due to pollen: Secondary | ICD-10-CM | POA: Diagnosis not present

## 2021-03-11 DIAGNOSIS — J3089 Other allergic rhinitis: Secondary | ICD-10-CM | POA: Diagnosis not present

## 2021-03-15 DIAGNOSIS — J3089 Other allergic rhinitis: Secondary | ICD-10-CM | POA: Diagnosis not present

## 2021-03-15 DIAGNOSIS — J3081 Allergic rhinitis due to animal (cat) (dog) hair and dander: Secondary | ICD-10-CM | POA: Diagnosis not present

## 2021-03-15 DIAGNOSIS — J301 Allergic rhinitis due to pollen: Secondary | ICD-10-CM | POA: Diagnosis not present

## 2021-03-23 DIAGNOSIS — J3089 Other allergic rhinitis: Secondary | ICD-10-CM | POA: Diagnosis not present

## 2021-03-23 DIAGNOSIS — J301 Allergic rhinitis due to pollen: Secondary | ICD-10-CM | POA: Diagnosis not present

## 2021-03-23 DIAGNOSIS — J3081 Allergic rhinitis due to animal (cat) (dog) hair and dander: Secondary | ICD-10-CM | POA: Diagnosis not present

## 2021-04-01 DIAGNOSIS — J3089 Other allergic rhinitis: Secondary | ICD-10-CM | POA: Diagnosis not present

## 2021-04-01 DIAGNOSIS — J3081 Allergic rhinitis due to animal (cat) (dog) hair and dander: Secondary | ICD-10-CM | POA: Diagnosis not present

## 2021-04-01 DIAGNOSIS — J301 Allergic rhinitis due to pollen: Secondary | ICD-10-CM | POA: Diagnosis not present

## 2021-04-06 DIAGNOSIS — J3089 Other allergic rhinitis: Secondary | ICD-10-CM | POA: Diagnosis not present

## 2021-04-06 DIAGNOSIS — J3081 Allergic rhinitis due to animal (cat) (dog) hair and dander: Secondary | ICD-10-CM | POA: Diagnosis not present

## 2021-04-06 DIAGNOSIS — J301 Allergic rhinitis due to pollen: Secondary | ICD-10-CM | POA: Diagnosis not present

## 2021-04-13 DIAGNOSIS — J3089 Other allergic rhinitis: Secondary | ICD-10-CM | POA: Diagnosis not present

## 2021-04-13 DIAGNOSIS — J301 Allergic rhinitis due to pollen: Secondary | ICD-10-CM | POA: Diagnosis not present

## 2021-04-13 DIAGNOSIS — J3081 Allergic rhinitis due to animal (cat) (dog) hair and dander: Secondary | ICD-10-CM | POA: Diagnosis not present

## 2021-04-19 DIAGNOSIS — J3081 Allergic rhinitis due to animal (cat) (dog) hair and dander: Secondary | ICD-10-CM | POA: Diagnosis not present

## 2021-04-19 DIAGNOSIS — J3089 Other allergic rhinitis: Secondary | ICD-10-CM | POA: Diagnosis not present

## 2021-04-19 DIAGNOSIS — J301 Allergic rhinitis due to pollen: Secondary | ICD-10-CM | POA: Diagnosis not present

## 2021-04-28 DIAGNOSIS — J3089 Other allergic rhinitis: Secondary | ICD-10-CM | POA: Diagnosis not present

## 2021-04-28 DIAGNOSIS — J301 Allergic rhinitis due to pollen: Secondary | ICD-10-CM | POA: Diagnosis not present

## 2021-04-28 DIAGNOSIS — J3081 Allergic rhinitis due to animal (cat) (dog) hair and dander: Secondary | ICD-10-CM | POA: Diagnosis not present

## 2021-05-03 DIAGNOSIS — J3089 Other allergic rhinitis: Secondary | ICD-10-CM | POA: Diagnosis not present

## 2021-05-03 DIAGNOSIS — J301 Allergic rhinitis due to pollen: Secondary | ICD-10-CM | POA: Diagnosis not present

## 2021-05-03 DIAGNOSIS — J3081 Allergic rhinitis due to animal (cat) (dog) hair and dander: Secondary | ICD-10-CM | POA: Diagnosis not present

## 2021-05-11 DIAGNOSIS — J301 Allergic rhinitis due to pollen: Secondary | ICD-10-CM | POA: Diagnosis not present

## 2021-05-11 DIAGNOSIS — J3081 Allergic rhinitis due to animal (cat) (dog) hair and dander: Secondary | ICD-10-CM | POA: Diagnosis not present

## 2021-05-11 DIAGNOSIS — J3089 Other allergic rhinitis: Secondary | ICD-10-CM | POA: Diagnosis not present

## 2021-05-19 DIAGNOSIS — J3081 Allergic rhinitis due to animal (cat) (dog) hair and dander: Secondary | ICD-10-CM | POA: Diagnosis not present

## 2021-05-19 DIAGNOSIS — J301 Allergic rhinitis due to pollen: Secondary | ICD-10-CM | POA: Diagnosis not present

## 2021-05-19 DIAGNOSIS — J3089 Other allergic rhinitis: Secondary | ICD-10-CM | POA: Diagnosis not present

## 2021-05-24 DIAGNOSIS — J3081 Allergic rhinitis due to animal (cat) (dog) hair and dander: Secondary | ICD-10-CM | POA: Diagnosis not present

## 2021-05-24 DIAGNOSIS — J301 Allergic rhinitis due to pollen: Secondary | ICD-10-CM | POA: Diagnosis not present

## 2021-05-24 DIAGNOSIS — J3089 Other allergic rhinitis: Secondary | ICD-10-CM | POA: Diagnosis not present

## 2021-06-03 DIAGNOSIS — J3081 Allergic rhinitis due to animal (cat) (dog) hair and dander: Secondary | ICD-10-CM | POA: Diagnosis not present

## 2021-06-03 DIAGNOSIS — J301 Allergic rhinitis due to pollen: Secondary | ICD-10-CM | POA: Diagnosis not present

## 2021-06-03 DIAGNOSIS — J3089 Other allergic rhinitis: Secondary | ICD-10-CM | POA: Diagnosis not present

## 2021-06-10 DIAGNOSIS — J3089 Other allergic rhinitis: Secondary | ICD-10-CM | POA: Diagnosis not present

## 2021-06-10 DIAGNOSIS — J301 Allergic rhinitis due to pollen: Secondary | ICD-10-CM | POA: Diagnosis not present

## 2021-06-10 DIAGNOSIS — J3081 Allergic rhinitis due to animal (cat) (dog) hair and dander: Secondary | ICD-10-CM | POA: Diagnosis not present

## 2021-06-17 DIAGNOSIS — J3089 Other allergic rhinitis: Secondary | ICD-10-CM | POA: Diagnosis not present

## 2021-06-17 DIAGNOSIS — J3081 Allergic rhinitis due to animal (cat) (dog) hair and dander: Secondary | ICD-10-CM | POA: Diagnosis not present

## 2021-06-17 DIAGNOSIS — J301 Allergic rhinitis due to pollen: Secondary | ICD-10-CM | POA: Diagnosis not present

## 2021-06-22 DIAGNOSIS — J301 Allergic rhinitis due to pollen: Secondary | ICD-10-CM | POA: Diagnosis not present

## 2021-06-22 DIAGNOSIS — J3081 Allergic rhinitis due to animal (cat) (dog) hair and dander: Secondary | ICD-10-CM | POA: Diagnosis not present

## 2021-06-22 DIAGNOSIS — J3089 Other allergic rhinitis: Secondary | ICD-10-CM | POA: Diagnosis not present

## 2021-06-30 DIAGNOSIS — J3089 Other allergic rhinitis: Secondary | ICD-10-CM | POA: Diagnosis not present

## 2021-06-30 DIAGNOSIS — J301 Allergic rhinitis due to pollen: Secondary | ICD-10-CM | POA: Diagnosis not present

## 2021-06-30 DIAGNOSIS — J3081 Allergic rhinitis due to animal (cat) (dog) hair and dander: Secondary | ICD-10-CM | POA: Diagnosis not present

## 2021-07-07 DIAGNOSIS — J3089 Other allergic rhinitis: Secondary | ICD-10-CM | POA: Diagnosis not present

## 2021-07-07 DIAGNOSIS — J3081 Allergic rhinitis due to animal (cat) (dog) hair and dander: Secondary | ICD-10-CM | POA: Diagnosis not present

## 2021-07-07 DIAGNOSIS — J301 Allergic rhinitis due to pollen: Secondary | ICD-10-CM | POA: Diagnosis not present

## 2021-07-13 DIAGNOSIS — G8929 Other chronic pain: Secondary | ICD-10-CM | POA: Diagnosis not present

## 2021-07-13 DIAGNOSIS — E7800 Pure hypercholesterolemia, unspecified: Secondary | ICD-10-CM | POA: Diagnosis not present

## 2021-07-13 DIAGNOSIS — Z0000 Encounter for general adult medical examination without abnormal findings: Secondary | ICD-10-CM | POA: Diagnosis not present

## 2021-07-13 DIAGNOSIS — Z1329 Encounter for screening for other suspected endocrine disorder: Secondary | ICD-10-CM | POA: Diagnosis not present

## 2021-07-13 DIAGNOSIS — E291 Testicular hypofunction: Secondary | ICD-10-CM | POA: Diagnosis not present

## 2021-07-13 DIAGNOSIS — Z13220 Encounter for screening for lipoid disorders: Secondary | ICD-10-CM | POA: Diagnosis not present

## 2021-07-13 DIAGNOSIS — Z7982 Long term (current) use of aspirin: Secondary | ICD-10-CM | POA: Diagnosis not present

## 2021-07-13 DIAGNOSIS — I2510 Atherosclerotic heart disease of native coronary artery without angina pectoris: Secondary | ICD-10-CM | POA: Diagnosis not present

## 2021-07-13 DIAGNOSIS — M545 Low back pain, unspecified: Secondary | ICD-10-CM | POA: Diagnosis not present

## 2021-07-13 DIAGNOSIS — Z79899 Other long term (current) drug therapy: Secondary | ICD-10-CM | POA: Diagnosis not present

## 2021-07-13 DIAGNOSIS — M549 Dorsalgia, unspecified: Secondary | ICD-10-CM | POA: Diagnosis not present

## 2021-07-13 DIAGNOSIS — E559 Vitamin D deficiency, unspecified: Secondary | ICD-10-CM | POA: Diagnosis not present

## 2021-07-20 ENCOUNTER — Other Ambulatory Visit: Payer: Self-pay | Admitting: Cardiovascular Disease

## 2021-07-20 DIAGNOSIS — T466X5A Adverse effect of antihyperlipidemic and antiarteriosclerotic drugs, initial encounter: Secondary | ICD-10-CM

## 2021-07-20 DIAGNOSIS — J3081 Allergic rhinitis due to animal (cat) (dog) hair and dander: Secondary | ICD-10-CM | POA: Diagnosis not present

## 2021-07-20 DIAGNOSIS — I2510 Atherosclerotic heart disease of native coronary artery without angina pectoris: Secondary | ICD-10-CM

## 2021-07-20 DIAGNOSIS — J3089 Other allergic rhinitis: Secondary | ICD-10-CM | POA: Diagnosis not present

## 2021-07-20 DIAGNOSIS — E782 Mixed hyperlipidemia: Secondary | ICD-10-CM

## 2021-07-20 DIAGNOSIS — G720 Drug-induced myopathy: Secondary | ICD-10-CM

## 2021-07-20 DIAGNOSIS — J301 Allergic rhinitis due to pollen: Secondary | ICD-10-CM | POA: Diagnosis not present

## 2021-07-21 NOTE — Progress Notes (Signed)
Cardiology Office Note   Date:  07/22/2021   ID:  William Bernard, DOB 04/15/1946, MRN [id number]  PCP:  Karlene Einstein, MD  Cardiologist:   Mertie Moores, MD   Chief Complaint  Patient presents with   Coronary Artery Disease   Problem list: 1. Coronary artery disease 2. Hyperlipidemia William Bernard is a 75 year old gentleman with a history of coronary artery disease. Status post PTCA and stenting of his left anterior descending artery 2005. He's status post cutting balloon atherectomy to the stent in November of 2005. He also has a history of hypercholesterolemia and elevated CPK levels. He's had elevated liver enzymes with multiple statins.  He's been able to tolerate Lipitor 40 mg a day without too much difficulty.  He has retired since I last saw him.  He still works out on a regular basis - works out at [employer] regularly.  He has cramping in his calves when he runs. He runs on trails which causes him to run on his toes a lot. He thinks that this may be the cause of his calf cramps.   He denies any chest pain with exercise.  February 06, 2013:  William Bernard is doing well.  He still has lots of muscle cramps associated with the statin.   February 16, 2014:  William Bernard is doing well.  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  Still runs,   Oct. 26, 2015:  William Bernard is doing well.  Usual aches and pains.   Still exercising.   He cut his atorvastatin to 20 mg last year ( at the recommendation of [employer]). Also started taking Vit D Also taking Zetia   January 15, 2015:   William Bernard is a 75 y.o. male who presents for follow up of his CAD and thanksgiving. Still having lots of leg cramps - presumably due to statin.  Not running as much. Spinning more  Jan. 18, 2017:  Doing well.  Scheduled for blood work  Is on Pralulent and LDL is now 99.   Is being seen in Lipid clinic. Does not have leg / muscle aching since being off the statin . Does not want to restart any statin  No angina.   Very active.   Still exercising regularly   -runs regularly ,   Jan. 23, 2018;  Doing well. No episodes of chest pain or shortness breath. He is still exercising on a regular basis. Has several questions Is on Pralulent.   And also is on Zetia.   Last LDL is 86 ([facility])   LDL - 86 Chol - 177 Trigs - 64 HDL - 77 Non - HDL 100  Takes Advil for muscle soreness when he lifts weights.  Questions about his wife,   A bit overweight.    Feb. 8, 2019:  Doing well,   Is cycling instead of running ( has plantar fascitis)  Notices that he is slowing down.  Doesn't recover as fast when he reaches his aneorobic threshold.    July 31, 2019: Notnamed is seen today for follow-up visit he has been having some episodes of near syncope recently.  For the past weeks, typically on Wed. AM While reading  Has pressure in his head,  Gets nauseous, gets a taste in his mouth, feeling goes down to stomach. Last 3-4 seconds Will occur once and not again    Still running ,  Runs 4 miles a day , 2-3 times a week and 7 miles on the weekend. Lifting light  weights.   Takes allergy shots on Mondays and Tuesdays.  Takes testosterone shots on Monday s   Eating regularly , cereal and banana for breakfast   Does not take his BP   January 09, 2020:  William Bernard is seen today for follow-up visit.  He is a very active gentleman is been an avid runner all of his life.  He he has a history of coronary artery disease and hyperlipidemia. No CP , no dyspnea.   Got his first covid vaccine yesterday  No further episodes of presyncope. - he is better after getting more protein and salt.    Sept. 14, 2021:  William Bernard is doing well.  Has had 2 separate episodes of CP while running   No further episodes.  Is running as far as 7 miles a day at times.   Labs from primary MD  LDL is 68 Total cholesterol is 144 Triglyceride level 65 HDL is 69 Non-HDL cholesterol is 75   Sept. 23, 2022 William Bernard is seen for follow up visit Having some  back issues  No cardiac  Still very active  Is cycling , not running now .   He brought labs from his primary medical doctor.  His total cholesterol is 141 LDL cholesterol is 74 Triglyceride level is 47 HDL is 63.   Past Medical History:  Diagnosis Date   Coronary artery disease    post PTCA and stenting of his LAD in 2005, he is statuts post Cutting Balloon atherectomy of the stent in November 2005     Elevated CPK    Elevated liver enzymes    due to multiple statin medication   Hyperlipidemia     Past Surgical History:  Procedure Laterality Date   CARDIAC CATHETERIZATION  09/23/2004   Subacute thrombosis of the previous long Taxus stent to the mid LAD with residual thrombus -- Residual disease involving the ostium of the first diagonal branch -- Ulcerated plaque involving the right coronary artery.   CORONARY ANGIOPLASTY  09/26/2004    Successful PTCA and cutting balloon of the proximal left anterior descending artery --  Partially successful angioplasty involving the first diagonal vessel We will continue with Integrilin, Plavix.  He will likely have to take Plavix lifelong   CORONARY ANGIOPLASTY WITH STENT PLACEMENT  12/25/2003   Est. EF of 50%. -- stenting of LAD -- Single vessel coronary artery disease involving the LAD and diagonal system    EYE SURGERY     KNEE SURGERY     TONSILLECTOMY     VASECTOMY       Current Outpatient Medications  Medication Sig Dispense Refill   aspirin 81 MG tablet Take 81 mg by mouth daily.       azelastine (OPTIVAR) 0.05 % ophthalmic solution Place 2 drops into both eyes 2 (two) times daily.     Cholecalciferol (VITAMIN D) 2000 UNITS CAPS Take 1 capsule (2,000 Units total) by mouth daily. 30 capsule    clopidogrel (PLAVIX) 75 MG tablet Take 75 mg by mouth daily.       COVID-19 mRNA vaccine, Moderna, 100 MCG/0.5ML injection INJECT AS DIRECTED .25 mL 0   EPINEPHrine 0.3 mg/0.3 mL IJ SOAJ injection INJ UTD PRN     ezetimibe (ZETIA) 10 MG  tablet Take 1 tablet (10 mg total) by mouth daily. Please make yearly appt with Dr.Odessie Polzin for September2022 for future refills.Thank you1st attempt 90 tablet 1   Fexofenadine HCl (ALLEGRA PO) Take 1 tablet by mouth daily.  fluticasone (FLONASE) 50 MCG/ACT nasal spray Place 1 spray into both nostrils daily.     REPATHA SURECLICK 631 MG/ML SOAJ Inject 140mg  subcutaneously every 14 days. 6 mL 0   testosterone cypionate (DEPOTESTOSTERONE CYPIONATE) 200 MG/ML injection Inject 0.25 mLs into the muscle once a week.      UNABLE TO FIND once a week. Med Name:Allergy shot     zinc gluconate 50 MG tablet Take 1 tablet by mouth daily.     Naproxen Sodium 220 MG CAPS Take 1 capsule by mouth as needed. (Patient not taking: Reported on 07/22/2021)     No current facility-administered medications for this visit.    Allergies:   Niaspan [niacin er] and Simvastatin    Social History:  The patient  reports that he has never smoked. He has never used smokeless tobacco. He reports that he does not drink alcohol and does not use drugs.   Family History:  The patient's family history includes Diabetes in his mother; Heart failure in his mother; Hyperlipidemia in his mother; Liver disease in his father; Transient ischemic attack in his mother.    ROS: as per current hx . Otherwise negativ e   Physical Exam: Blood pressure 118/78, pulse 61, height 5' 8.5" (1.74 m), weight 142 lb (64.4 kg), SpO2 99 %.  GEN:  Well nourished, well developed in no acute distress HEENT: Normal NECK: No JVD; No carotid bruits LYMPHATICS: No lymphadenopathy CARDIAC: RRR , no murmurs, rubs, gallops RESPIRATORY:  Clear to auscultation without rales, wheezing or rhonchi  ABDOMEN: Soft, non-tender, non-distended MUSCULOSKELETAL:  No edema; No deformity  SKIN: Warm and dry NEUROLOGIC:  Alert and oriented x 3   EKG:     Sept. 23, 2022.   NSR with PAC .  HR of 61.   Recent Labs: No results found for requested labs within last  8760 hours.    Lipid Panel    Component Value Date/Time   CHOL 153 01/09/2020 0819   TRIG 63 01/09/2020 0819   HDL 71 01/09/2020 0819   CHOLHDL 2.2 01/09/2020 0819   CHOLHDL 2.5 11/17/2015 0951   VLDL 11 11/17/2015 0951   LDLCALC 69 01/09/2020 0819      Wt Readings from Last 3 Encounters:  07/22/21 142 lb (64.4 kg)  07/13/20 142 lb 6.4 oz (64.6 kg)  01/09/20 142 lb 12.8 oz (64.8 kg)      Other studies Reviewed: Additional studies/ records that were reviewed today include: . Review of the above records demonstrates:    ASSESSMENT AND PLAN:  1. Coronary artery disease-     no angina .    2. Hyperlipidemia-labs from primary MD look great   3.  Near syncope -      4.  Back pain :  have given him [name] Dy office number   Current medicines are reviewed at length with the patient today.  The patient has concerns regarding medicines.  The following changes have been made:  no change  Labs/ tests ordered today include:   No orders of the defined types were placed in this encounter.   Disposition:      Signed, Mertie Moores, MD  07/22/2021 8:17 AM    [facility] [place], [place],   [address] Phone: [phone]; Fax: [phone]

## 2021-07-22 ENCOUNTER — Other Ambulatory Visit: Payer: Self-pay

## 2021-07-22 ENCOUNTER — Ambulatory Visit: Payer: PPO | Admitting: Cardiovascular Disease

## 2021-07-22 ENCOUNTER — Encounter: Payer: Self-pay | Admitting: Cardiovascular Disease

## 2021-07-22 VITALS — BP 118/78 | HR 61 | Ht 68.5 in | Wt 142.0 lb

## 2021-07-22 DIAGNOSIS — E782 Mixed hyperlipidemia: Secondary | ICD-10-CM

## 2021-07-22 DIAGNOSIS — I2510 Atherosclerotic heart disease of native coronary artery without angina pectoris: Secondary | ICD-10-CM | POA: Diagnosis not present

## 2021-07-22 NOTE — Patient Instructions (Signed)
Bjorn Loser   - [phone]   Medication Instructions:  Your physician recommends that you continue on your current medications as directed. Please refer to the Current Medication list given to you today.  *If you need a refill on your cardiac medications before your next appointment, please call your pharmacy*   Lab Work: None Ordered If you have labs (blood work) drawn today and your tests are completely normal, you will receive your results only by: Van (if you have MyChart) OR A paper copy in the mail If you have any lab test that is abnormal or we need to change your treatment, we will call you to review the results.   Testing/Procedures: None Ordered    Follow-Up: At [facility], you and your health needs are our priority.  As part of our continuing mission to provide you with exceptional heart care, we have created designated Provider Care Teams.  These Care Teams include your primary Cardiologist (physician) and Advanced Practice Providers (APPs -  Physician Assistants and Nurse Practitioners) who all work together to provide you with the care you need, when you need it.   Your next appointment:   1 year(s)  The format for your next appointment:   In Person  Provider:   You may see Mertie Moores, MD or one of the following Advanced Practice Providers on your designated Care Team:   Richardson Dopp, PA-C [place], [place]

## 2021-07-25 DIAGNOSIS — E291 Testicular hypofunction: Secondary | ICD-10-CM | POA: Diagnosis not present

## 2021-07-28 DIAGNOSIS — J3081 Allergic rhinitis due to animal (cat) (dog) hair and dander: Secondary | ICD-10-CM | POA: Diagnosis not present

## 2021-07-28 DIAGNOSIS — J301 Allergic rhinitis due to pollen: Secondary | ICD-10-CM | POA: Diagnosis not present

## 2021-07-28 DIAGNOSIS — J3089 Other allergic rhinitis: Secondary | ICD-10-CM | POA: Diagnosis not present

## 2021-08-03 DIAGNOSIS — J3089 Other allergic rhinitis: Secondary | ICD-10-CM | POA: Diagnosis not present

## 2021-08-03 DIAGNOSIS — J301 Allergic rhinitis due to pollen: Secondary | ICD-10-CM | POA: Diagnosis not present

## 2021-08-03 DIAGNOSIS — J3081 Allergic rhinitis due to animal (cat) (dog) hair and dander: Secondary | ICD-10-CM | POA: Diagnosis not present

## 2021-08-04 DIAGNOSIS — J3081 Allergic rhinitis due to animal (cat) (dog) hair and dander: Secondary | ICD-10-CM | POA: Diagnosis not present

## 2021-08-04 DIAGNOSIS — J301 Allergic rhinitis due to pollen: Secondary | ICD-10-CM | POA: Diagnosis not present

## 2021-08-04 DIAGNOSIS — J3089 Other allergic rhinitis: Secondary | ICD-10-CM | POA: Diagnosis not present

## 2021-08-11 DIAGNOSIS — Z23 Encounter for immunization: Secondary | ICD-10-CM | POA: Diagnosis not present

## 2021-08-17 DIAGNOSIS — J301 Allergic rhinitis due to pollen: Secondary | ICD-10-CM | POA: Diagnosis not present

## 2021-08-17 DIAGNOSIS — J3089 Other allergic rhinitis: Secondary | ICD-10-CM | POA: Diagnosis not present

## 2021-08-17 DIAGNOSIS — J3081 Allergic rhinitis due to animal (cat) (dog) hair and dander: Secondary | ICD-10-CM | POA: Diagnosis not present

## 2021-08-24 ENCOUNTER — Other Ambulatory Visit: Payer: Self-pay

## 2021-08-24 MED ORDER — EZETIMIBE 10 MG PO TABS: 10.0000 mg | ORAL_TABLET | Freq: Every day | ORAL | 2 refills | Status: DC

## 2021-09-08 DIAGNOSIS — J3089 Other allergic rhinitis: Secondary | ICD-10-CM | POA: Diagnosis not present

## 2021-09-08 DIAGNOSIS — J301 Allergic rhinitis due to pollen: Secondary | ICD-10-CM | POA: Diagnosis not present

## 2021-09-08 DIAGNOSIS — J3081 Allergic rhinitis due to animal (cat) (dog) hair and dander: Secondary | ICD-10-CM | POA: Diagnosis not present

## 2021-09-15 DIAGNOSIS — J301 Allergic rhinitis due to pollen: Secondary | ICD-10-CM | POA: Diagnosis not present

## 2021-09-15 DIAGNOSIS — J3089 Other allergic rhinitis: Secondary | ICD-10-CM | POA: Diagnosis not present

## 2021-09-15 DIAGNOSIS — J3081 Allergic rhinitis due to animal (cat) (dog) hair and dander: Secondary | ICD-10-CM | POA: Diagnosis not present

## 2021-09-20 DIAGNOSIS — J3089 Other allergic rhinitis: Secondary | ICD-10-CM | POA: Diagnosis not present

## 2021-09-20 DIAGNOSIS — J301 Allergic rhinitis due to pollen: Secondary | ICD-10-CM | POA: Diagnosis not present

## 2021-09-20 DIAGNOSIS — J3081 Allergic rhinitis due to animal (cat) (dog) hair and dander: Secondary | ICD-10-CM | POA: Diagnosis not present

## 2021-09-23 ENCOUNTER — Emergency Department (HOSPITAL_BASED_OUTPATIENT_CLINIC_OR_DEPARTMENT_OTHER): Payer: PPO

## 2021-09-23 ENCOUNTER — Encounter (HOSPITAL_BASED_OUTPATIENT_CLINIC_OR_DEPARTMENT_OTHER): Payer: Self-pay | Admitting: *Deleted

## 2021-09-23 ENCOUNTER — Emergency Department (HOSPITAL_BASED_OUTPATIENT_CLINIC_OR_DEPARTMENT_OTHER)
Admission: EM | Admit: 2021-09-23 | Discharge: 2021-09-23 | Disposition: A | Discharge status: Home / Self Care | Payer: PPO | Attending: Emergency Medicine | Admitting: Emergency Medicine

## 2021-09-23 ENCOUNTER — Other Ambulatory Visit: Payer: Self-pay

## 2021-09-23 DIAGNOSIS — I2510 Atherosclerotic heart disease of native coronary artery without angina pectoris: Secondary | ICD-10-CM | POA: Diagnosis not present

## 2021-09-23 DIAGNOSIS — Z7982 Long term (current) use of aspirin: Secondary | ICD-10-CM | POA: Insufficient documentation

## 2021-09-23 DIAGNOSIS — M25551 Pain in right hip: Secondary | ICD-10-CM | POA: Diagnosis not present

## 2021-09-23 DIAGNOSIS — M545 Low back pain, unspecified: Secondary | ICD-10-CM | POA: Insufficient documentation

## 2021-09-23 DIAGNOSIS — Z955 Presence of coronary angioplasty implant and graft: Secondary | ICD-10-CM | POA: Diagnosis not present

## 2021-09-23 DIAGNOSIS — R52 Pain, unspecified: Secondary | ICD-10-CM

## 2021-09-23 DIAGNOSIS — M533 Sacrococcygeal disorders, not elsewhere classified: Secondary | ICD-10-CM | POA: Diagnosis not present

## 2021-09-23 MED ORDER — CYCLOBENZAPRINE HCL 10 MG PO TABS: 10.0000 mg | ORAL_TABLET | Freq: Two times a day (BID) | ORAL | 0 refills | Status: DC | PRN

## 2021-09-23 NOTE — ED Triage Notes (Signed)
Hx of chronic back pain. States for the past 2 days he has a new pain in his coccyx.

## 2021-09-23 NOTE — Discharge Instructions (Signed)
Follow-up with your primary care doctor.  Can also follow-up with spine or orthopedic specialist.  Try the muscle relaxer.  If you develop numbness, weakness, bladder or bowel incontinence or other new concerning symptom, come back to ER for reassessment.

## 2021-09-24 NOTE — ED Provider Notes (Signed)
[facility] EMERGENCY DEPARTMENT Provider Note   CSN: [id number] Arrival date & time: 09/23/21  1117     History Chief Complaint  Patient presents with   Back Pain    William Bernard is a 75 y.o. male.  Presents to ER with concern for low back pain.  Patient reports that he has suffered from chronic back pain for years but the pain over the last couple days seems to be worse and seems to be in a slightly different location.  Pain normally is in his low back however over the past couple days pain seems to be in his low back and his tailbone region and right hip.  States that he was wrestling with a family member a couple days ago may have injured it then but does not recall any specific blunt trauma.  Has been able to walk.  No numbness or weakness or bladder or bowel incontinence.  Pain is currently mild but worsens with movement.  Improves with rest.  Aching.  HPI     Past Medical History:  Diagnosis Date   Coronary artery disease    post PTCA and stenting of his LAD in 2005, he is statuts post Cutting Balloon atherectomy of the stent in November 2005     Elevated CPK    Elevated liver enzymes    due to multiple statin medication   Hyperlipidemia     Patient Active Problem List   Diagnosis Date Noted   Statin myopathy 10/29/2019   CAD (coronary artery disease) 05/19/2011   Hyperlipidemia 05/19/2011    Past Surgical History:  Procedure Laterality Date   CARDIAC CATHETERIZATION  09/23/2004   Subacute thrombosis of the previous long Taxus stent to the mid LAD with residual thrombus -- Residual disease involving the ostium of the first diagonal branch -- Ulcerated plaque involving the right coronary artery.   CORONARY ANGIOPLASTY  09/26/2004    Successful PTCA and cutting balloon of the proximal left anterior descending artery --  Partially successful angioplasty involving the first diagonal vessel We will continue with Integrilin, Plavix.  He will likely have to take  Plavix lifelong   CORONARY ANGIOPLASTY WITH STENT PLACEMENT  12/25/2003   Est. EF of 50%. -- stenting of LAD -- Single vessel coronary artery disease involving the LAD and diagonal system    EYE SURGERY     KNEE SURGERY     TONSILLECTOMY     VASECTOMY         Family History  Problem Relation Age of Onset   Liver disease Father    Hyperlipidemia Mother    Heart failure Mother    Diabetes Mother    Transient ischemic attack Mother     Social History   Tobacco Use   Smoking status: Never   Smokeless tobacco: Never  Vaping Use   Vaping Use: Never used  Substance Use Topics   Alcohol use: No   Drug use: No    Home Medications Prior to Admission medications   Medication Sig Start Date End Date Taking? Authorizing Provider  cyclobenzaprine (FLEXERIL) 10 MG tablet Take 1 tablet (10 mg total) by mouth 2 (two) times daily as needed for muscle spasms. 09/23/21  Yes Lucrezia Starch, MD  aspirin 81 MG tablet Take 81 mg by mouth daily.      [provider]  azelastine (OPTIVAR) 0.05 % ophthalmic solution Place 2 drops into both eyes 2 (two) times daily. 01/23/19   [provider]  Cholecalciferol (  VITAMIN D) 2000 UNITS CAPS Take 1 capsule (2,000 Units total) by mouth daily. 02/24/14   Nahser, Wonda Cheng, MD  clopidogrel (PLAVIX) 75 MG tablet Take 75 mg by mouth daily.      [provider]  COVID-19 mRNA vaccine, Moderna, 100 MCG/0.5ML injection INJECT AS DIRECTED 11/08/20 11/08/21  Carlyle Basques, MD  EPINEPHrine 0.3 mg/0.3 mL IJ SOAJ injection INJ UTD PRN 03/26/19   [provider]  ezetimibe (ZETIA) 10 MG tablet Take 1 tablet (10 mg total) by mouth daily. 08/24/21   Nahser, Wonda Cheng, MD  Fexofenadine HCl (ALLEGRA PO) Take 1 tablet by mouth daily.    [provider]  fluticasone (FLONASE) 50 MCG/ACT nasal spray Place 1 spray into both nostrils daily.    [provider]  Naproxen Sodium 220 MG CAPS Take 1 capsule by mouth as  needed. Patient not taking: Reported on 07/22/2021    [provider]  REPATHA SURECLICK 263 MG/ML SOAJ Inject 140mg  subcutaneously every 14 days. 07/20/21   Nahser, Wonda Cheng, MD  testosterone cypionate (DEPOTESTOSTERONE CYPIONATE) 200 MG/ML injection Inject 0.25 mLs into the muscle once a week.  02/03/19   [provider]  UNABLE TO FIND once a week. Med Name:Allergy shot    [provider]  zinc gluconate 50 MG tablet Take 1 tablet by mouth daily.    [provider]    Allergies    Niaspan [niacin er] and Simvastatin  Review of Systems   Review of Systems  Constitutional:  Negative for chills and fever.  HENT:  Negative for ear pain and sore throat.   Eyes:  Negative for pain and visual disturbance.  Respiratory:  Negative for cough and shortness of breath.   Cardiovascular:  Negative for chest pain and palpitations.  Gastrointestinal:  Negative for abdominal pain and vomiting.  Genitourinary:  Negative for dysuria and hematuria.  Musculoskeletal:  Positive for arthralgias and back pain.  Skin:  Negative for color change and rash.  Neurological:  Negative for seizures and syncope.  All other systems reviewed and are negative.  Physical Exam Updated Vital Signs BP (!) 127/101 (BP Location: Right Arm)   Pulse 76   Temp 98 F (36.7 C) (Oral)   Resp 14   Ht 5' 8.5" (1.74 m)   Wt 29.2 kg   SpO2 98%   BMI 9.65 kg/m   Physical Exam Vitals and nursing note reviewed.  Constitutional:      General: He is not in acute distress.    Appearance: He is well-developed.  HENT:     Head: Normocephalic and atraumatic.  Eyes:     Conjunctiva/sclera: Conjunctivae normal.  Cardiovascular:     Rate and Rhythm: Normal rate and regular rhythm.     Heart sounds: No murmur heard. Pulmonary:     Effort: Pulmonary effort is normal. No respiratory distress.     Breath sounds: Normal breath sounds.  Abdominal:     Palpations: Abdomen is soft.     Tenderness:  There is no abdominal tenderness.  Musculoskeletal:        General: No swelling or deformity.     Cervical back: Neck supple.     Comments: Slight tenderness noted in coccyx region, right hip region, no deformity appreciated, normal joint ROM  Skin:    General: Skin is warm and dry.     Capillary Refill: Capillary refill takes less than 2 seconds.  Neurological:     General: No focal deficit present.  Mental Status: He is alert.  Psychiatric:        Mood and Affect: Mood normal.    ED Results / Procedures / Treatments   Labs (all labs ordered are listed, but only abnormal results are displayed) Labs Reviewed - No data to display  EKG None  Radiology DG Sacrum/Coccyx  Result Date: 09/23/2021 CLINICAL DATA:  Pain EXAM: SACRUM AND COCCYX - 2+ VIEW COMPARISON:  None. FINDINGS: No displaced fracture is seen. SI joints are unremarkable. Degenerative changes are noted in visualized lower lumbar spine with disc Bernard narrowing, bony spurs and facet hypertrophy. Last lumbar vertebra is transitional. Osteopenia is seen in bony structures. IMPRESSION: No recent fracture or dislocation is seen in the sacrum and coccyx. Electronically Signed   By: Elmer Picker M.D.   On: 09/23/2021 12:43   DG HIP UNILAT WITH PELVIS 2-3 VIEWS RIGHT  Result Date: 09/23/2021 CLINICAL DATA:  Pain EXAM: DG HIP (WITH OR WITHOUT PELVIS) 2-3V RIGHT COMPARISON:  None. FINDINGS: No fracture or dislocation is seen. Osteopenia is seen in bony structures. Joint spaces in both hips appear symmetrical. Arterial calcifications are seen in the soft tissues. IMPRESSION: No radiographic abnormality is seen in right hip. Electronically Signed   By: Elmer Picker M.D.   On: 09/23/2021 12:44    Procedures Procedures   Medications Ordered in ED Medications - No data to display  ED Course  I have reviewed the triage vital signs and the nursing notes.  Pertinent labs & imaging results that were available during  my care of the patient were reviewed by me and considered in my medical decision making (see chart for details).    MDM Rules/Calculators/A&P                            75 year old who reports long history of back pain presents to ER with pain to coccyx, right hip.  On exam he appears well in no distress.  There was tenderness in this region but no deformity.  He is neurovascular intact.  Plain films are negative.  Suspect MSK strain.  Recommend follow-up with primary doctor.  Rx for muscle relaxer.  After the discussed management above, the patient was determined to be safe for discharge.  The patient was in agreement with this plan and all questions regarding their care were answered.  ED return precautions were discussed and the patient will return to the ED with any significant worsening of condition.  Final Clinical Impression(s) / ED Diagnoses Final diagnoses:  Low back pain, unspecified back pain laterality, unspecified chronicity, unspecified whether sciatica present    Rx / DC Orders ED Discharge Orders          Ordered    cyclobenzaprine (FLEXERIL) 10 MG tablet  2 times daily PRN        09/23/21 1318             Lucrezia Starch, MD 09/24/21 [phone]

## 2021-09-30 DIAGNOSIS — J301 Allergic rhinitis due to pollen: Secondary | ICD-10-CM | POA: Diagnosis not present

## 2021-09-30 DIAGNOSIS — J3089 Other allergic rhinitis: Secondary | ICD-10-CM | POA: Diagnosis not present

## 2021-09-30 DIAGNOSIS — J3081 Allergic rhinitis due to animal (cat) (dog) hair and dander: Secondary | ICD-10-CM | POA: Diagnosis not present

## 2021-10-05 DIAGNOSIS — M545 Low back pain, unspecified: Secondary | ICD-10-CM | POA: Diagnosis not present

## 2021-10-06 DIAGNOSIS — J3081 Allergic rhinitis due to animal (cat) (dog) hair and dander: Secondary | ICD-10-CM | POA: Diagnosis not present

## 2021-10-06 DIAGNOSIS — J301 Allergic rhinitis due to pollen: Secondary | ICD-10-CM | POA: Diagnosis not present

## 2021-10-06 DIAGNOSIS — J3089 Other allergic rhinitis: Secondary | ICD-10-CM | POA: Diagnosis not present

## 2021-10-12 DIAGNOSIS — J3081 Allergic rhinitis due to animal (cat) (dog) hair and dander: Secondary | ICD-10-CM | POA: Diagnosis not present

## 2021-10-12 DIAGNOSIS — J3089 Other allergic rhinitis: Secondary | ICD-10-CM | POA: Diagnosis not present

## 2021-10-12 DIAGNOSIS — J301 Allergic rhinitis due to pollen: Secondary | ICD-10-CM | POA: Diagnosis not present

## 2021-10-19 DIAGNOSIS — J3081 Allergic rhinitis due to animal (cat) (dog) hair and dander: Secondary | ICD-10-CM | POA: Diagnosis not present

## 2021-10-19 DIAGNOSIS — J301 Allergic rhinitis due to pollen: Secondary | ICD-10-CM | POA: Diagnosis not present

## 2021-10-19 DIAGNOSIS — J3089 Other allergic rhinitis: Secondary | ICD-10-CM | POA: Diagnosis not present

## 2021-12-28 ENCOUNTER — Other Ambulatory Visit: Payer: Self-pay | Admitting: Cardiovascular Disease

## 2021-12-28 DIAGNOSIS — I2510 Atherosclerotic heart disease of native coronary artery without angina pectoris: Secondary | ICD-10-CM

## 2021-12-28 DIAGNOSIS — G720 Drug-induced myopathy: Secondary | ICD-10-CM

## 2021-12-28 DIAGNOSIS — T466X5A Adverse effect of antihyperlipidemic and antiarteriosclerotic drugs, initial encounter: Secondary | ICD-10-CM

## 2021-12-28 DIAGNOSIS — E782 Mixed hyperlipidemia: Secondary | ICD-10-CM

## 2022-05-16 ENCOUNTER — Other Ambulatory Visit: Payer: Self-pay

## 2022-05-16 MED ORDER — EZETIMIBE 10 MG PO TABS: 10.0000 mg | ORAL_TABLET | Freq: Every day | ORAL | 0 refills | Status: DC

## 2022-08-10 ENCOUNTER — Encounter: Payer: Self-pay | Admitting: Cardiovascular Disease

## 2022-08-10 NOTE — Progress Notes (Signed)
Cardiology Office Note   Date:  08/11/2022   ID:  William Bernard, DOB [birth year], MRN [id number]  PCP:  Vickii Penna, MD  Cardiologist:   Mertie Moores, MD   Chief Complaint  Patient presents with   Coronary Artery Disease   Problem list: 1. Coronary artery disease 2. Hyperlipidemia William Bernard is a 76 year old gentleman with a history of coronary artery disease. Status post PTCA and stenting of his left anterior descending artery 2005. He's status post cutting balloon atherectomy to the stent in November of 2005. He also has a history of hypercholesterolemia and elevated CPK levels. He's had elevated liver enzymes with multiple statins.  He's been able to tolerate Lipitor 40 mg a day without too much difficulty.  He has retired since I last saw him.  He still works out on a regular basis - works out at [employer] regularly.  He has cramping in his calves when he runs. He runs on trails which causes him to run on his toes a lot. He thinks that this may be the cause of his calf cramps.   He denies any chest pain with exercise.  February 06, 2013:  William Bernard is doing well.  He still has lots of muscle cramps associated with the statin.   February 16, 2014:  [facility] is doing well.  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  Still runs,   Oct. 26, 2015:  William Bernard is doing well.  Usual aches and pains.   Still exercising.   He cut his atorvastatin to 20 mg last year ( at the recommendation of [employer]). Also started taking Vit D Also taking Zetia   January 15, 2015:   William Bernard is a 76 y.o. male who presents for follow up of his CAD and thanksgiving. Still having lots of leg cramps - presumably due to statin.  Not running as much. Spinning more  Jan. 18, 2017:  Doing well.  Scheduled for blood work  Is on Pralulent and LDL is now 99.   Is being seen in Lipid clinic. Does not have leg / muscle aching since being off the statin . Does not want to restart any statin  No angina.   Very active.   Still exercising regularly   -runs regularly ,   Jan. 23, 2018;  Doing well. No episodes of chest pain or shortness breath. He is still exercising on a regular basis. Has several questions Is on Pralulent.   And also is on Zetia.   Last LDL is 86 ([facility])   LDL - 86 Chol - 177 Trigs - 64 HDL - 77 Non - HDL 100  Takes Advil for muscle soreness when he lifts weights.  Questions about his wife,   A bit overweight.    Feb. 8, 2019:  Doing well,   Is cycling instead of running ( has plantar fascitis)  Notices that he is slowing down.  Doesn't recover as fast when he reaches his aneorobic threshold.    July 31, 2019: Rees is seen today for follow-up visit he has been having some episodes of near syncope recently.  For the past weeks, typically on Wed. AM While reading  Has pressure in his head,  Gets nauseous, gets a taste in his mouth, feeling goes down to stomach. Last 3-4 seconds Will occur once and not again    Still running ,  Runs 4 miles a day , 2-3 times a week and 7 miles on the weekend. Lifting light  weights.   Takes allergy shots on Mondays and Tuesdays.  Takes testosterone shots on Monday s   Eating regularly , cereal and banana for breakfast   Does not take his BP   January 09, 2020:  [facility] is seen today for follow-up visit.  He is a very active gentleman is been an avid runner all of his life.  He he has a history of coronary artery disease and hyperlipidemia. No CP , no dyspnea.   Got his first covid vaccine yesterday  No further episodes of presyncope. - he is better after getting more protein and salt.    Sept. 14, 2021:  [facility] is doing well.  Has had 2 separate episodes of CP while running   No further episodes.  Is running as far as 7 miles a day at times.   Labs from primary MD  LDL is 68 Total cholesterol is 144 Triglyceride level 65 HDL is 69 Non-HDL cholesterol is 75   Sept. 23, 2022 [name] is seen for follow up visit Having some  back issues  No cardiac  Still very active  Is cycling , not running now .   He brought labs from his primary medical doctor.  His total cholesterol is 141 LDL cholesterol is 74 Triglyceride level is 47 HDL is 63.  Oct. 13, 2023 [facility] is seen for follow up of his CAD . HLD  He brought labs from Dr. Johny Bernard office.  He is total cholesterol is 129 HDL is 58 Triglyceride level is 41 LDL is 63. He has been on PCSK9 inhibitorfor 8 years - now on  Repatha  Exercising regularly  Is spinning 4 days a week .      Past Medical History:  Diagnosis Date   Coronary artery disease    post PTCA and stenting of his LAD in 2005, he is statuts post Cutting Balloon atherectomy of the stent in November 2005     Elevated CPK    Elevated liver enzymes    due to multiple statin medication   Hyperlipidemia     Past Surgical History:  Procedure Laterality Date   CARDIAC CATHETERIZATION  09/23/2004   Subacute thrombosis of the previous long Taxus stent to the mid LAD with residual thrombus -- Residual disease involving the ostium of the first diagonal branch -- Ulcerated plaque involving the right coronary artery.   CORONARY ANGIOPLASTY  09/26/2004    Successful PTCA and cutting balloon of the proximal left anterior descending artery --  Partially successful angioplasty involving the first diagonal vessel We will continue with Integrilin, Plavix.  He will likely have to take Plavix lifelong   CORONARY ANGIOPLASTY WITH STENT PLACEMENT  12/25/2003   Est. EF of 50%. -- stenting of LAD -- Single vessel coronary artery disease involving the LAD and diagonal system    EYE SURGERY     KNEE SURGERY     TONSILLECTOMY     VASECTOMY       Current Outpatient Medications  Medication Sig Dispense Refill   aspirin 81 MG tablet Take 81 mg by mouth daily.       azelastine (OPTIVAR) 0.05 % ophthalmic solution Place 2 drops into both eyes 2 (two) times daily.     Cholecalciferol (VITAMIN D) 2000 UNITS CAPS  Take 1 capsule (2,000 Units total) by mouth daily. 30 capsule    clopidogrel (PLAVIX) 75 MG tablet Take 75 mg by mouth daily.       EPINEPHrine 0.3 mg/0.3 mL IJ SOAJ  injection INJ UTD PRN     ezetimibe (ZETIA) 10 MG tablet Take 1 tablet (10 mg total) by mouth daily. 90 tablet 0   Fexofenadine HCl (ALLEGRA PO) Take 1 tablet by mouth daily.     fluticasone (FLONASE) 50 MCG/ACT nasal spray Place 1 spray into both nostrils daily.     REPATHA SURECLICK 858 MG/ML SOAJ Inject 1 pen into the skin every 14 (fourteen) days. 2 mL 11   testosterone cypionate (DEPOTESTOSTERONE CYPIONATE) 200 MG/ML injection Inject 0.25 mLs into the muscle once a week.      UNABLE TO FIND once a week. Med Name:Allergy shot     zinc gluconate 50 MG tablet Take 1 tablet by mouth daily.     No current facility-administered medications for this visit.    Allergies:   Niaspan [niacin er] and Simvastatin    Social History:  The patient  reports that he has never smoked. He has never used smokeless tobacco. He reports that he does not drink alcohol and does not use drugs.   Family History:  The patient's family history includes Diabetes in his mother; Heart failure in his mother; Hyperlipidemia in his mother; Liver disease in his father; Transient ischemic attack in his mother.    ROS: as per current hx . Otherwise negativ e   Physical Exam: Blood pressure 120/82, pulse 62, height 5' 8.5" (1.74 m), weight 141 lb 9.6 oz (64.2 kg), SpO2 98 %.       GEN:  Well nourished, well developed in no acute distress HEENT: Normal NECK: No JVD; No carotid bruits LYMPHATICS: No lymphadenopathy CARDIAC: RRR , no murmurs, rubs, gallops RESPIRATORY:  Clear to auscultation without rales, wheezing or rhonchi  ABDOMEN: Soft, non-tender, non-distended MUSCULOSKELETAL:  No edema; No deformity  SKIN: Warm and dry NEUROLOGIC:  Alert and oriented x 3    EKG:     August 11, 2022: Normal sinus rhythm.  Sinus arrhythmia.  Left axis  deviation.  No acute abnormalities.  Recent Labs: No results found for requested labs within last 365 days.    Lipid Panel    Component Value Date/Time   CHOL 153 01/09/2020 0819   TRIG 63 01/09/2020 0819   HDL 71 01/09/2020 0819   CHOLHDL 2.2 01/09/2020 0819   CHOLHDL 2.5 11/17/2015 0951   VLDL 11 11/17/2015 0951   LDLCALC 69 01/09/2020 0819      Wt Readings from Last 3 Encounters:  08/11/22 141 lb 9.6 oz (64.2 kg)  09/23/21 64 lb 6.4 oz (29.2 kg)  07/22/21 142 lb (64.4 kg)      Other studies Reviewed: Additional studies/ records that were reviewed today include: . Review of the above records demonstrates:    ASSESSMENT AND PLAN:  1. Coronary artery disease-    no angina.  Continue current medications.  2. Hyperlipidemia-lipid levels look great.  He is currently on Repatha.  3.  Near syncope -      4.  Back pain :    Current medicines are reviewed at length with the patient today.  The patient has concerns regarding medicines.  The following changes have been made:  no change  Labs/ tests ordered today include:   Orders Placed This Encounter  Procedures   EKG 12-Lead   We will see him again in 1 year.  Disposition:      Signed, Mertie Moores, MD  08/11/2022 9:34 AM    [place], [place]  [address] Phone: [phone]; Fax: [phone]

## 2022-08-11 ENCOUNTER — Encounter: Payer: Self-pay | Admitting: Cardiovascular Disease

## 2022-08-11 ENCOUNTER — Ambulatory Visit: Payer: PPO | Attending: Cardiovascular Disease | Admitting: Cardiovascular Disease

## 2022-08-11 VITALS — BP 120/82 | HR 62 | Ht 68.5 in | Wt 141.6 lb

## 2022-08-11 DIAGNOSIS — G720 Drug-induced myopathy: Secondary | ICD-10-CM | POA: Diagnosis not present

## 2022-08-11 DIAGNOSIS — T466X5D Adverse effect of antihyperlipidemic and antiarteriosclerotic drugs, subsequent encounter: Secondary | ICD-10-CM

## 2022-08-11 DIAGNOSIS — E782 Mixed hyperlipidemia: Secondary | ICD-10-CM | POA: Diagnosis not present

## 2022-08-11 DIAGNOSIS — I2510 Atherosclerotic heart disease of native coronary artery without angina pectoris: Secondary | ICD-10-CM | POA: Diagnosis not present

## 2022-08-11 NOTE — Patient Instructions (Signed)
Medication Instructions:  Your physician recommends that you continue on your current medications as directed. Please refer to the Current Medication list given to you today.  *If you need a refill on your cardiac medications before your next appointment, please call your pharmacy*   Lab Work: NONE If you have labs (blood work) drawn today and your tests are completely normal, you will receive your results only by: MyChart Message (if you have MyChart) OR A paper copy in the mail If you have any lab test that is abnormal or we need to change your treatment, we will call you to review the results.   Testing/Procedures: NONE   Follow-Up: At [facility], you and your health needs are our priority.  As part of our continuing mission to provide you with exceptional heart care, we have created designated Provider Care Teams.  These Care Teams include your primary Cardiologist (physician) and Advanced Practice Providers (APPs -  Physician Assistants and Nurse Practitioners) who all work together to provide you with the care you need, when you need it.  We recommend signing up for the patient portal called "MyChart".  Sign up information is provided on this After Visit Summary.  MyChart is used to connect with patients for Virtual Visits (Telemedicine).  Patients are able to view lab/test results, encounter notes, upcoming appointments, etc.  Non-urgent messages can be sent to your provider as well.   To learn more about what you can do with MyChart, go to [email or web address].    Your next appointment:   1 year(s)  The format for your next appointment:   In Person  Provider:   Philip Nahser, MD       Important Information About Sugar       

## 2022-08-20 IMAGING — DX DG SACRUM/COCCYX 2+V
3 series · 3 of 3 positions shown · non-contrast
Comparison: None.

CLINICAL DATA: Pain

EXAM:
SACRUM AND COCCYX - 2+ VIEW

[coccyx ap]
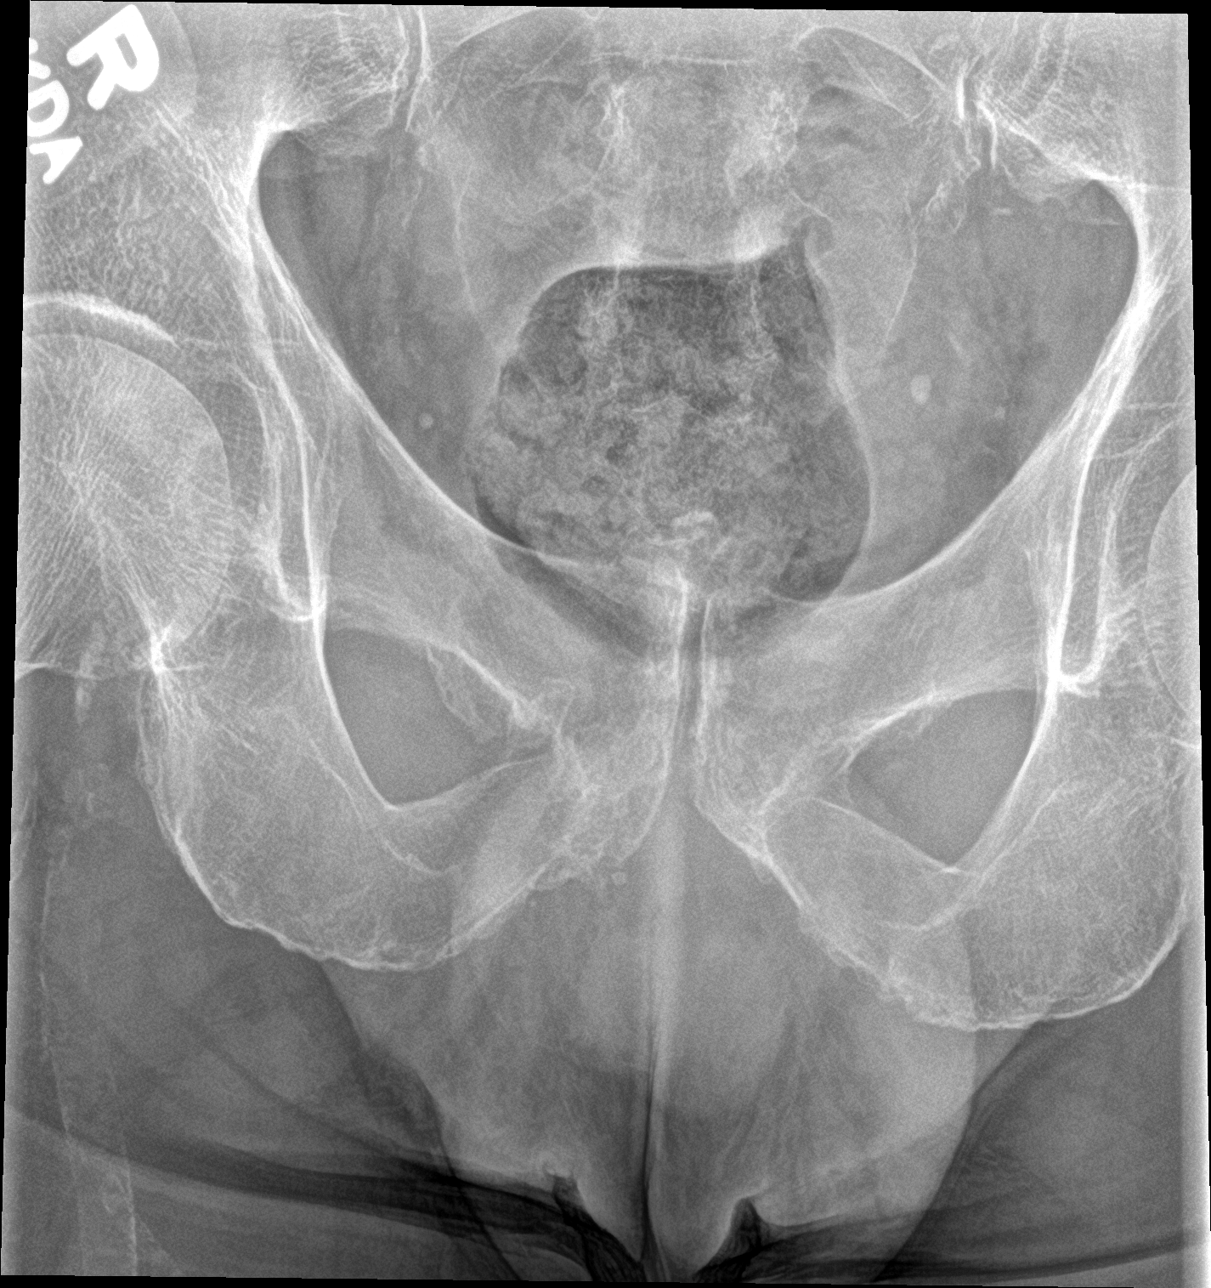

[sacrum ap]
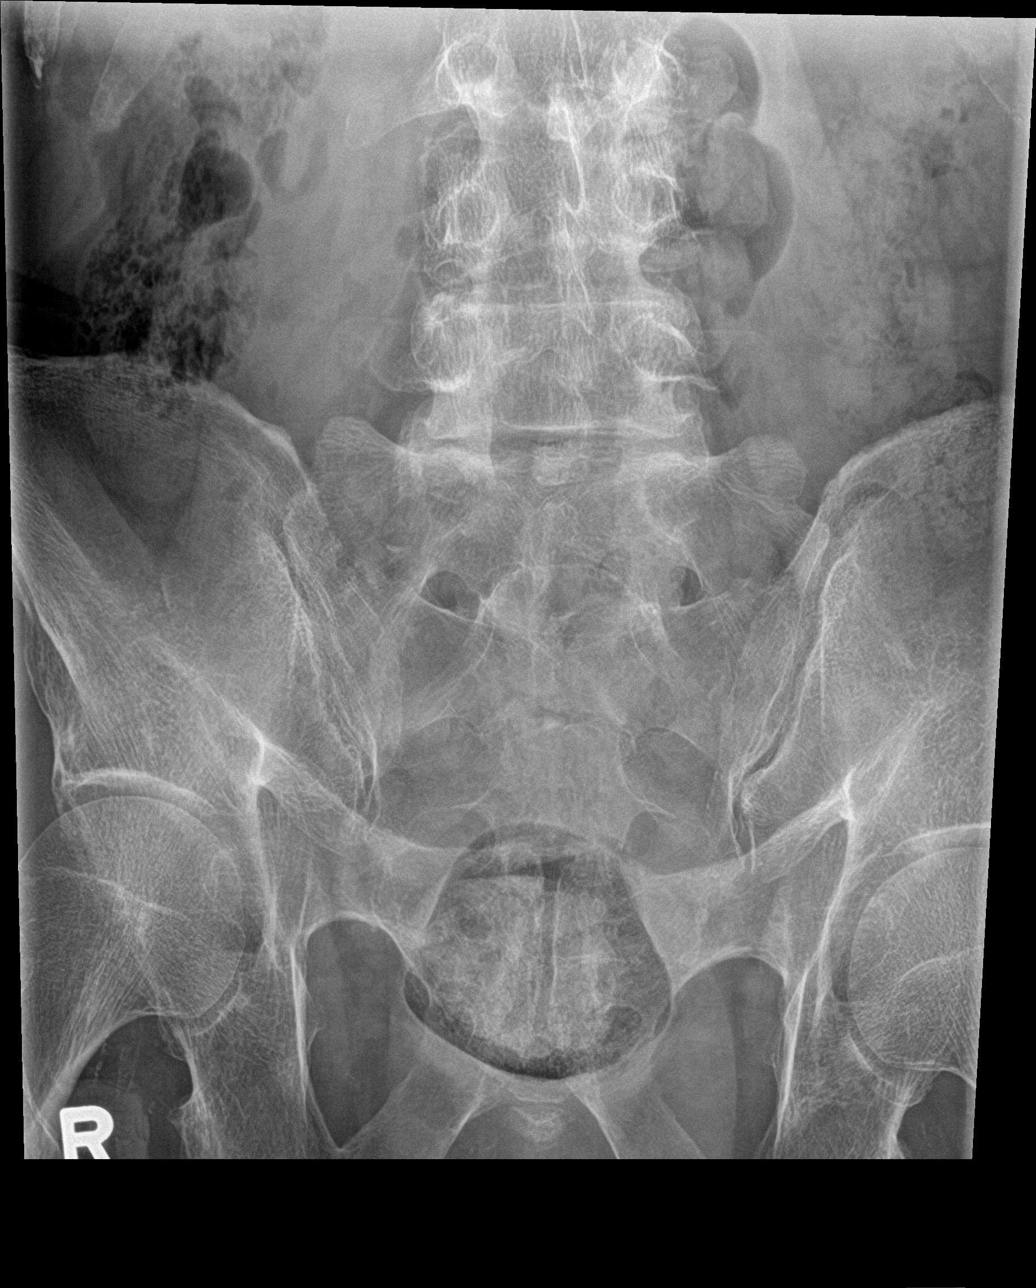

[sacrum lat]
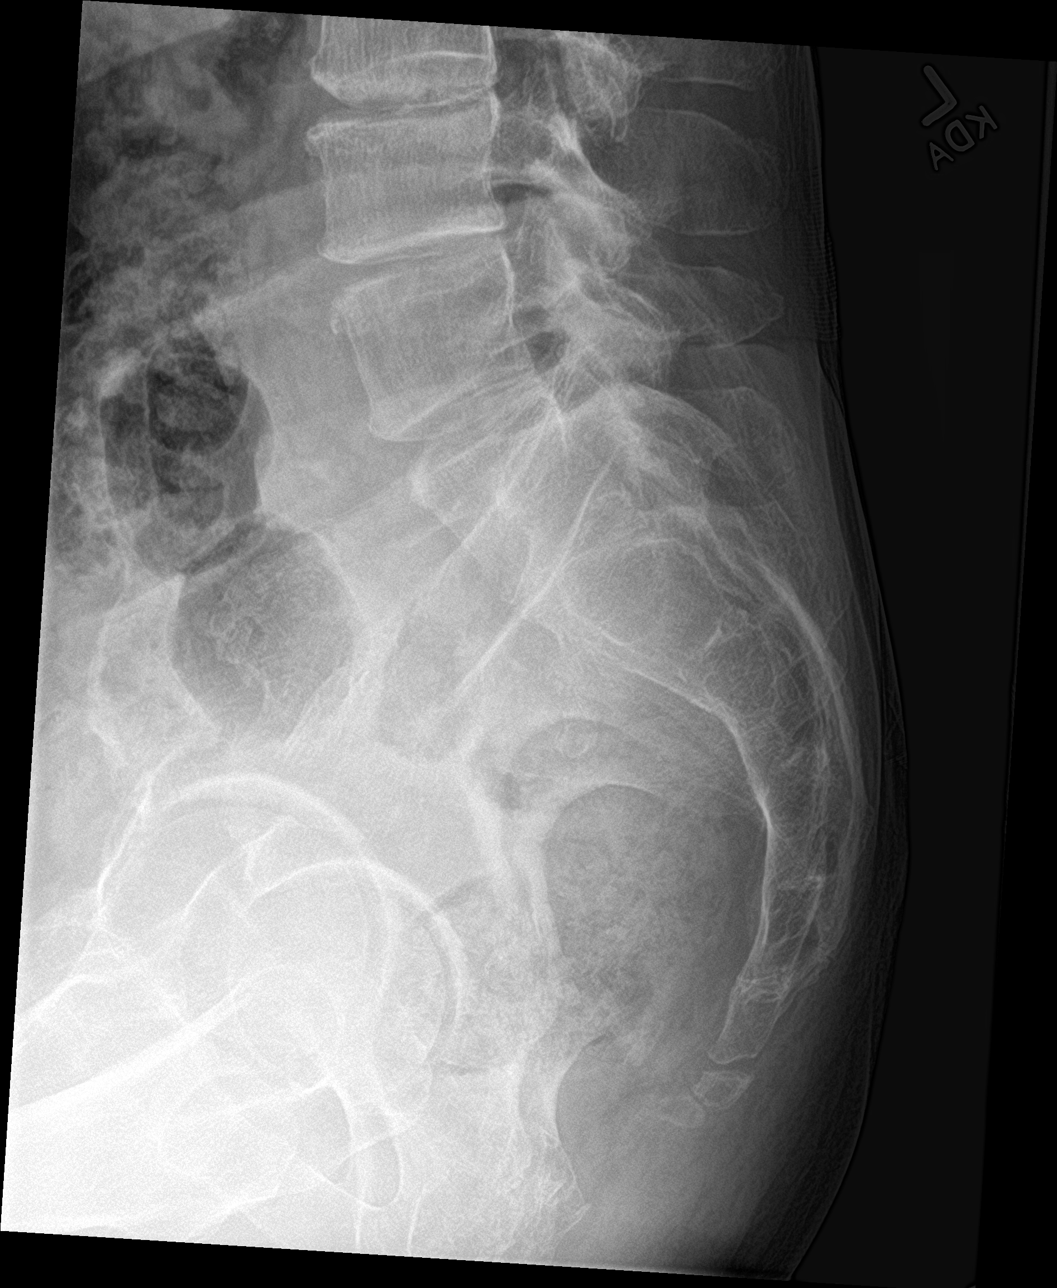

[3 of 3 positions shown; findings below may reference images not displayed]

FINDINGS: No displaced fracture is seen. SI joints are unremarkable.
Degenerative changes are noted in visualized lower lumbar spine with
disc space narrowing, bony spurs and facet hypertrophy. Last lumbar
vertebra is transitional. Osteopenia is seen in bony structures.
IMPRESSION: No recent fracture or dislocation is seen in the sacrum and coccyx.

## 2022-08-20 IMAGING — DX DG HIP (WITH OR WITHOUT PELVIS) 2-3V*R*
3 series · 3 of 3 positions shown · non-contrast
Comparison: None.

CLINICAL DATA: Pain

EXAM:
DG HIP (WITH OR WITHOUT PELVIS) 2-3V RIGHT

[pelvis ap]
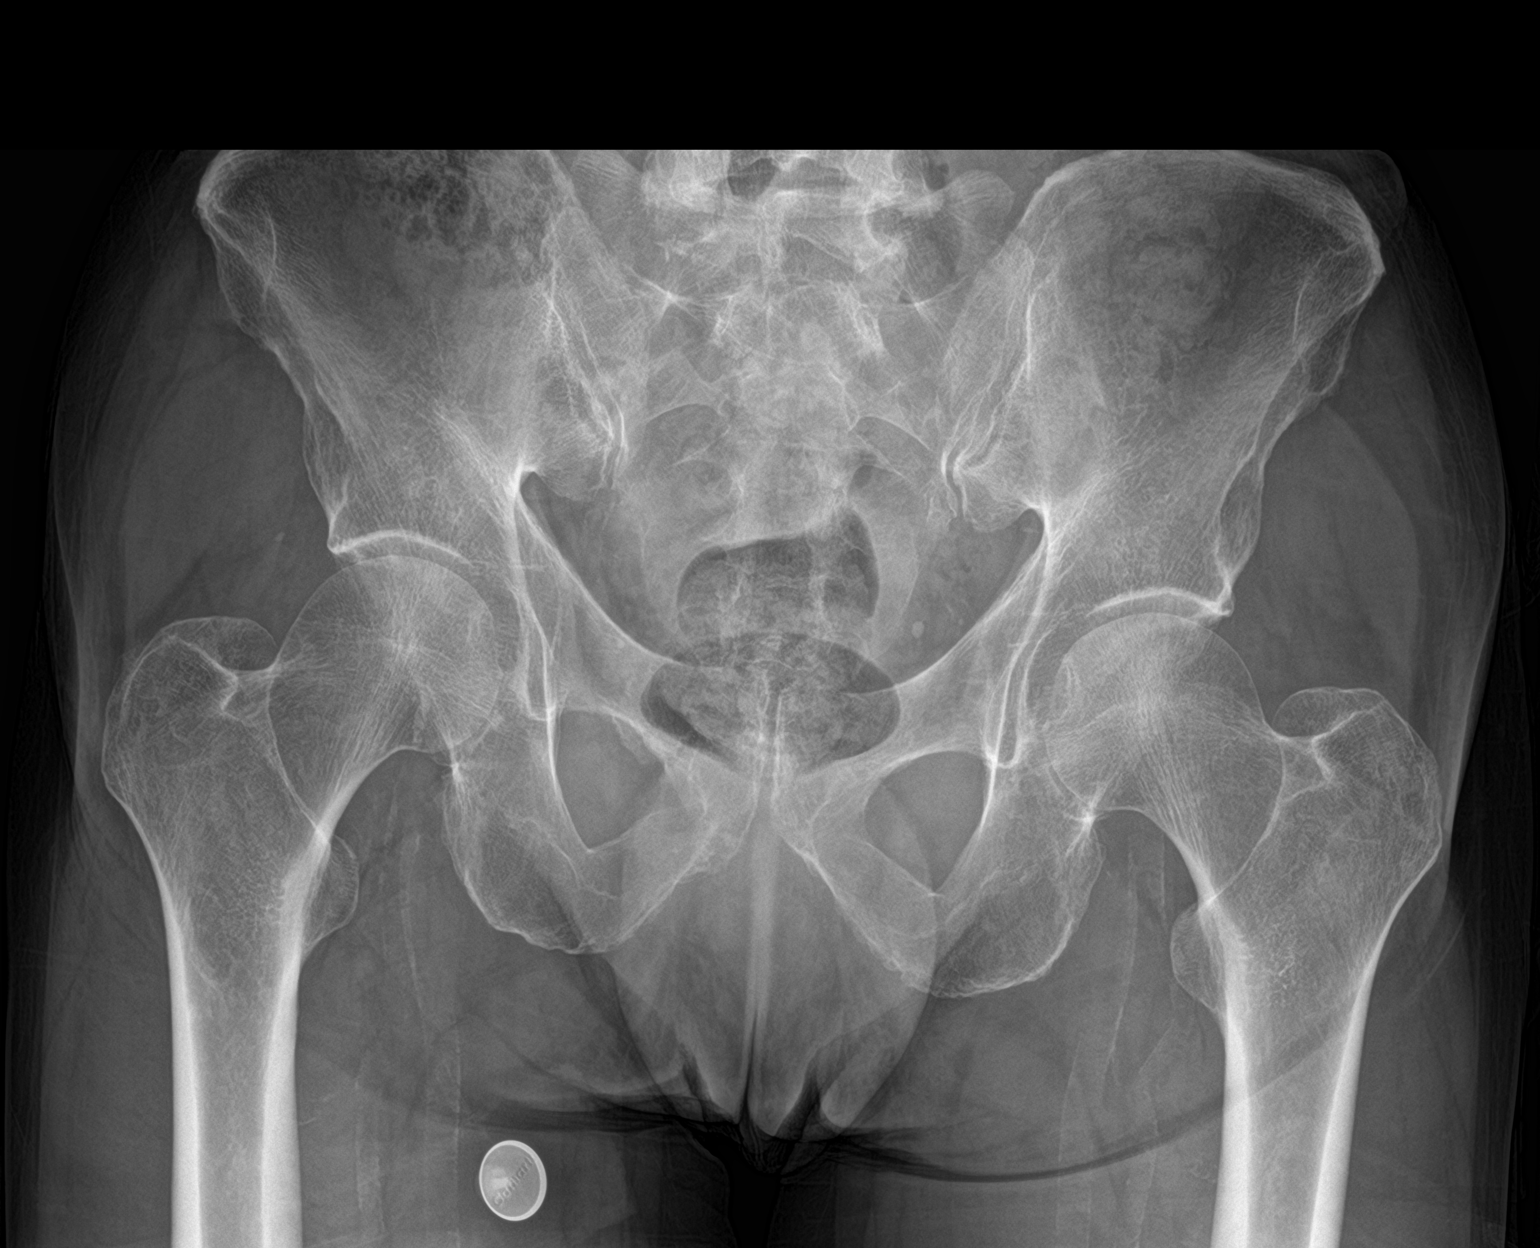

[hip ap]
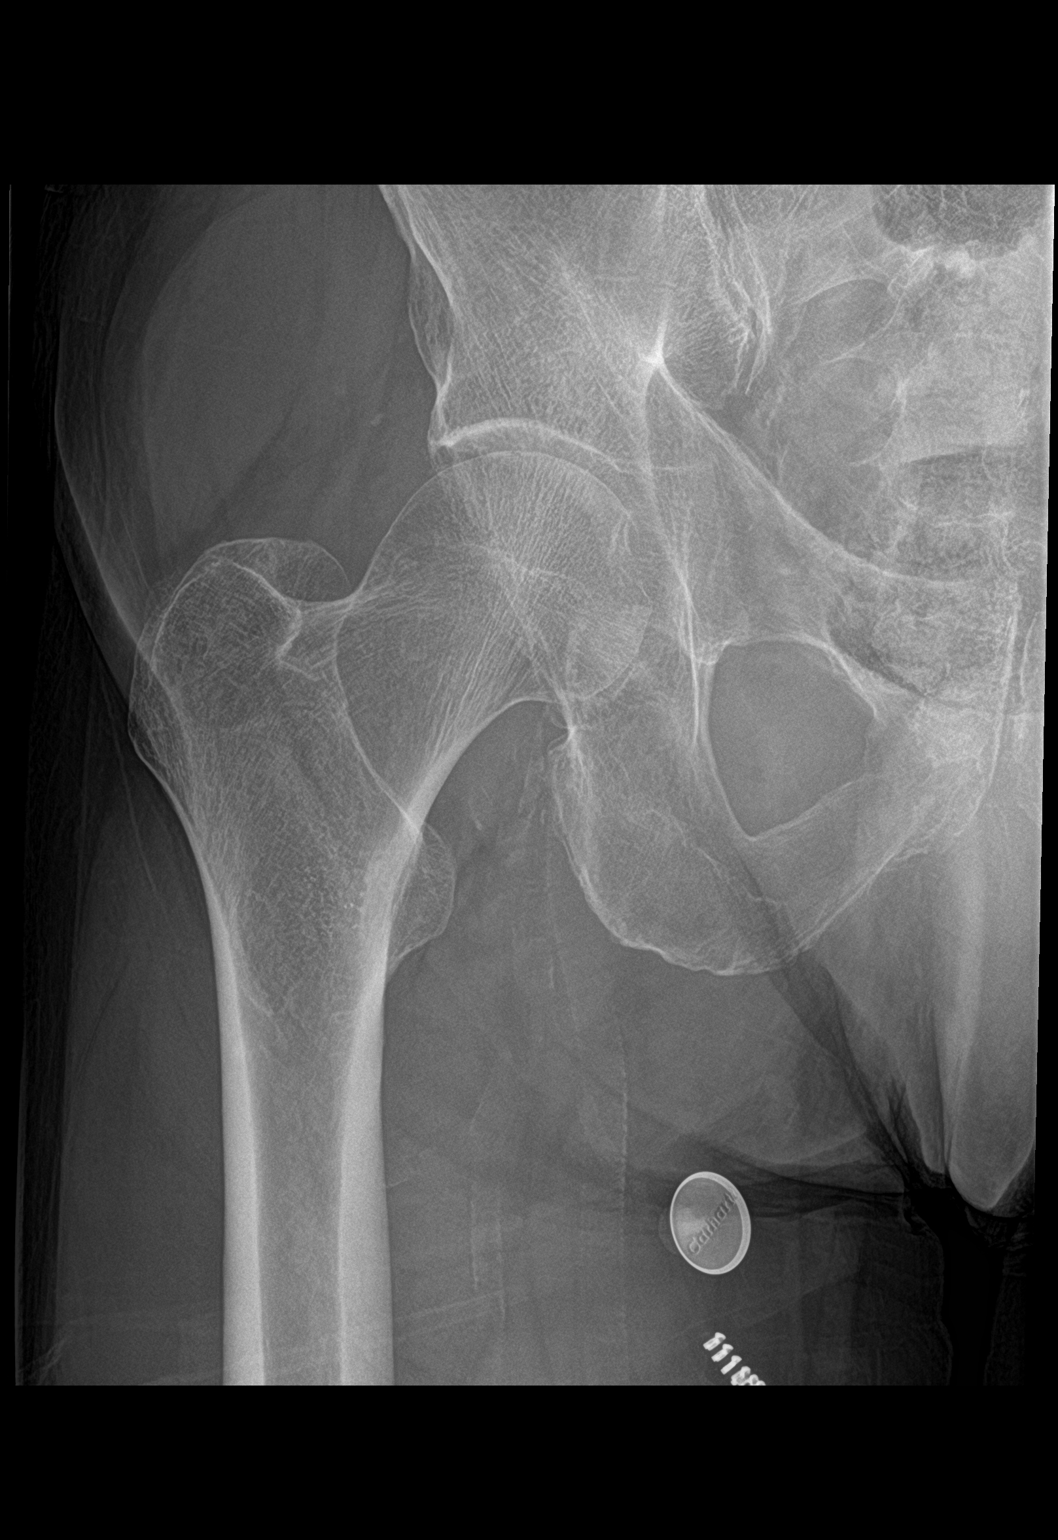

[hip lat]
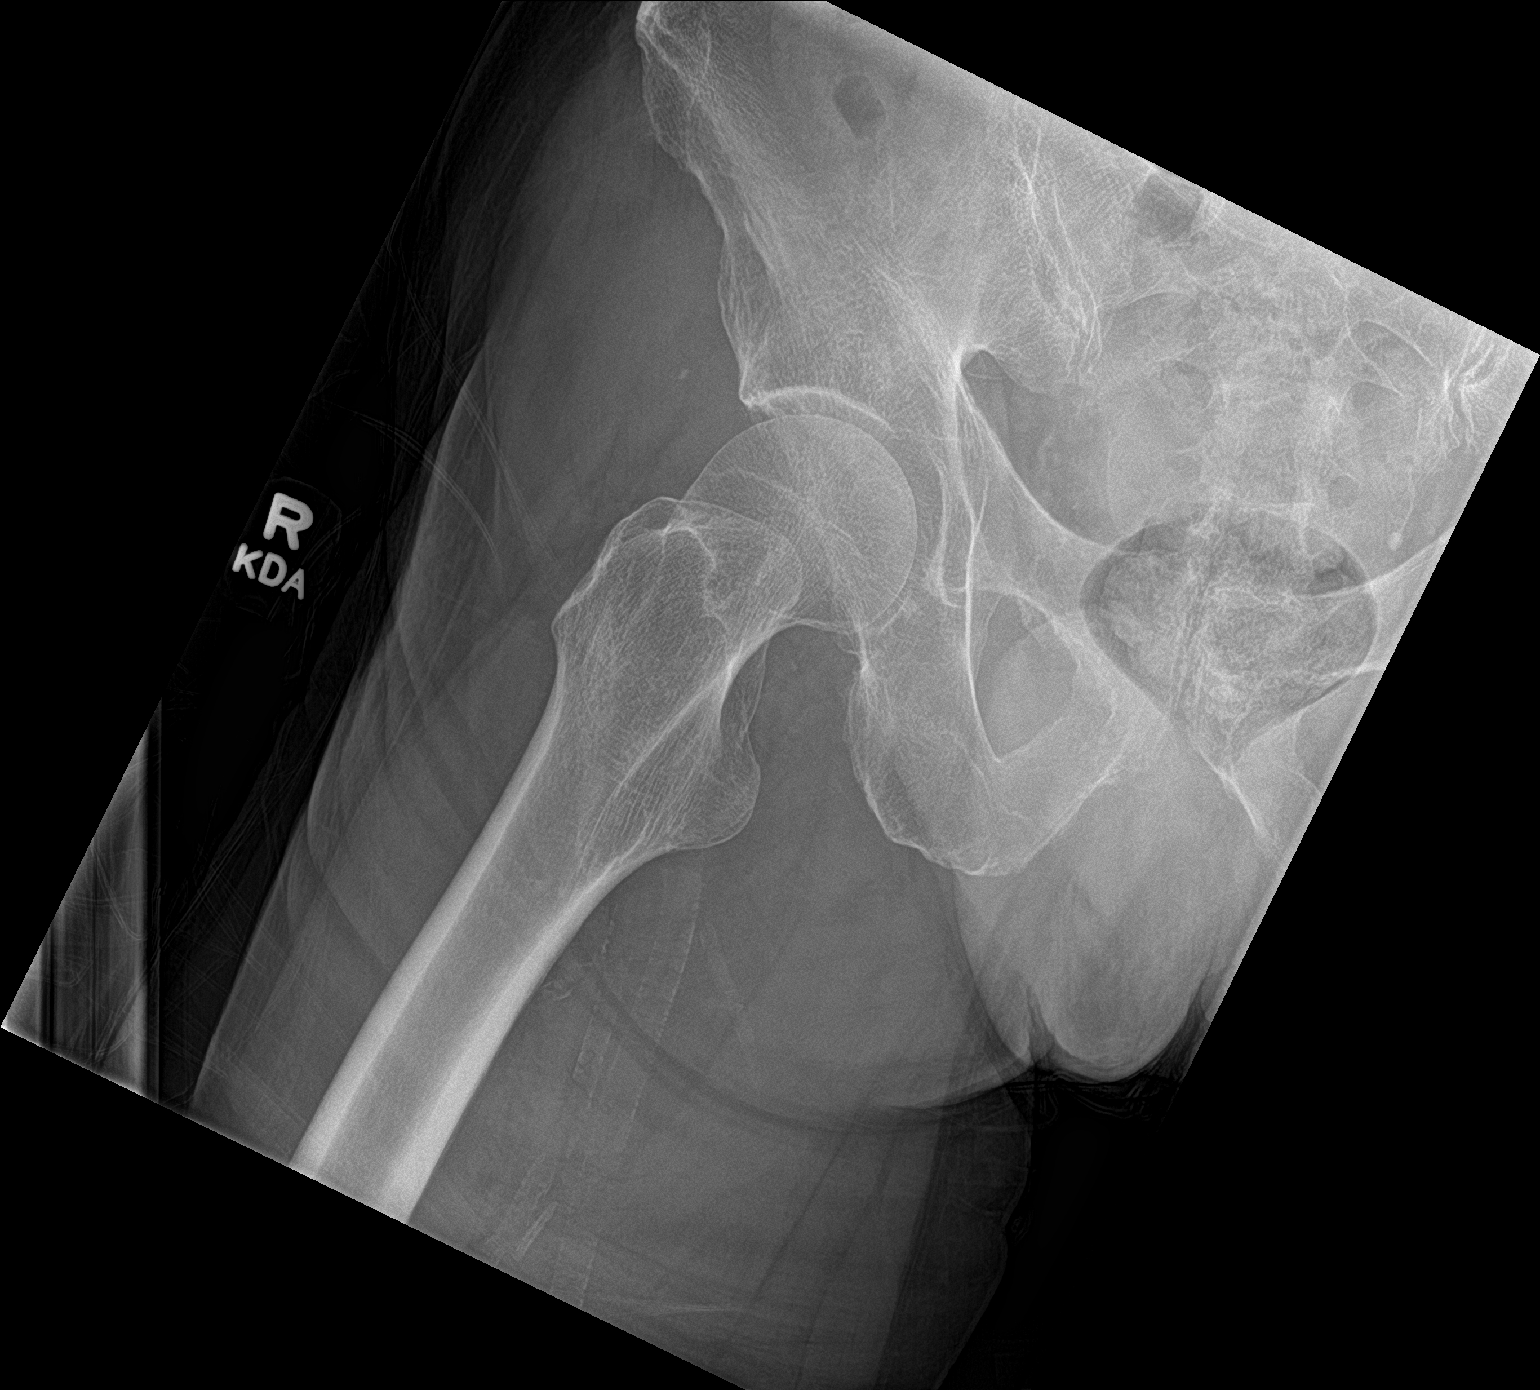

[3 of 3 positions shown; findings below may reference images not displayed]

FINDINGS: No fracture or dislocation is seen. Osteopenia is seen in bony
structures. Joint spaces in both hips appear symmetrical. Arterial
calcifications are seen in the soft tissues.
IMPRESSION: No radiographic abnormality is seen in right hip.

## 2022-09-08 ENCOUNTER — Other Ambulatory Visit: Payer: Self-pay

## 2022-09-08 MED ORDER — EZETIMIBE 10 MG PO TABS: 10.0000 mg | ORAL_TABLET | Freq: Every day | ORAL | 3 refills | Status: DC

## 2022-11-28 ENCOUNTER — Other Ambulatory Visit: Payer: Self-pay | Admitting: Cardiovascular Disease

## 2022-11-28 DIAGNOSIS — G720 Drug-induced myopathy: Secondary | ICD-10-CM

## 2022-11-28 DIAGNOSIS — E782 Mixed hyperlipidemia: Secondary | ICD-10-CM

## 2022-11-28 DIAGNOSIS — I2510 Atherosclerotic heart disease of native coronary artery without angina pectoris: Secondary | ICD-10-CM

## 2023-07-16 ENCOUNTER — Other Ambulatory Visit: Payer: Self-pay

## 2023-07-16 ENCOUNTER — Encounter: Payer: Self-pay | Admitting: Cardiovascular Disease

## 2023-07-16 MED ORDER — EZETIMIBE 10 MG PO TABS: 10.0000 mg | ORAL_TABLET | Freq: Every day | ORAL | 0 refills | Status: DC

## 2023-09-03 ENCOUNTER — Ambulatory Visit: Payer: PPO | Admitting: Cardiovascular Disease

## 2023-09-10 ENCOUNTER — Encounter: Payer: Self-pay | Admitting: Cardiovascular Disease

## 2023-09-10 ENCOUNTER — Telehealth: Payer: Self-pay | Admitting: Cardiovascular Disease

## 2023-09-10 NOTE — Telephone Encounter (Signed)
Pt is wondering if he needs to get his blood work drawn for tomorrow appt. Please advise

## 2023-09-10 NOTE — Telephone Encounter (Signed)
Returned call to patient to inform that we have updated labs in the system from April this year at [facility] so no need to do anything additional. Cholesterol looked great and pt attests he's still on his same medications.

## 2023-09-10 NOTE — Progress Notes (Unsigned)
Cardiology Office Note   Date:  09/11/2023   ID:  William Bernard, DOB Aug 01, 1946, MRN [id number]  PCP:  William Bernard., MD  Cardiologist:   William Miss, MD   Chief Complaint  Patient presents with   Coronary Artery Disease        Problem list: 1. Coronary artery disease 2. Hyperlipidemia William Bernard is a 77 year old gentleman with a history of coronary artery disease. Status post PTCA and stenting of his left anterior descending artery 2005. He's status post cutting balloon atherectomy to the stent in November of 2005. He also has a history of hypercholesterolemia and elevated CPK levels. He's had elevated liver enzymes with multiple statins.  He's been able to tolerate Lipitor 40 mg a day without too much difficulty.  He has retired since I last saw him.  He still works out on a regular basis - works out at [facility] regularly.  He has cramping in his calves when he runs. He runs on trails which causes him to run on his toes a lot. He thinks that this may be the cause of his calf cramps.   He denies any chest pain with exercise.  February 06, 2013:  William Bernard is doing well.  He still has lots of muscle cramps associated with the statin.   February 16, 2014:  William Bernard is doing well.  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  Still runs,   Oct. 26, 2015:  William Bernard is doing well.  Usual aches and pains.   Still exercising.   He cut his atorvastatin to 20 mg last year ( at the recommendation of [facility]). Also started taking Vit D Also taking Zetia   January 15, 2015:   William Bernard is a 77 y.o. male who presents for follow up of his CAD and thanksgiving. Still having lots of leg cramps - presumably due to statin.  Not running as much. Spinning more  Jan. 18, 2017:  Doing well.  Scheduled for blood work  Is on Pralulent and LDL is now 99.   Is being seen in Lipid clinic. Does not have leg / muscle aching since being off the statin . Does not want to restart any statin  No angina.   Very  active.  Still exercising regularly   -runs regularly ,   Jan. 23, 2018;  Doing well. No episodes of chest pain or shortness breath. He is still exercising on a regular basis. Has several questions Is on Pralulent.   And also is on Zetia.   Last LDL is 86 ([facility])   LDL - 86 Chol - 177 Trigs - 64 HDL - 77 Non - HDL 100  Takes Advil for muscle soreness when he lifts weights.  Questions about his wife,   A bit overweight.    Feb. 8, 2019:  Doing well,   Is cycling instead of running ( has plantar fascitis)  Notices that he is slowing down.  Doesn't recover as fast when he reaches his aneorobic threshold.    July 31, 2019: William Bernard is seen today for follow-up visit he has been having some episodes of near syncope recently.  For the past weeks, typically on Wed. AM While reading  Has pressure in his head,  Gets nauseous, gets a taste in his mouth, feeling goes down to stomach. Last 3-4 seconds Will occur once and not again    Still running ,  Runs 4 miles a day , 2-3 times a week and 7 miles  on the weekend. Lifting light weights.   Takes allergy shots on Mondays and Tuesdays.  Takes testosterone shots on Monday s   Eating regularly , cereal and banana for breakfast   Does not take his BP   January 09, 2020:  William Bernard is seen today for follow-up visit.  He is a very active gentleman is been an avid runner all of his life.  He he has a history of coronary artery disease and hyperlipidemia. No CP , no dyspnea.   Got his first covid vaccine yesterday  No further episodes of presyncope. - he is better after getting more protein and salt.    Sept. 14, 2021:  William Bernard is doing well.  Has had 2 separate episodes of CP while running   No further episodes.  Is running as far as 7 miles a day at times.   Labs from primary MD  LDL is 68 Total cholesterol is 144 Triglyceride level 65 HDL is 69 Non-HDL cholesterol is 75   Sept. 23, 2022 William Bernard is seen for follow up  visit Having some back issues  No cardiac  Still very active  Is cycling , not running now .   He brought labs from his primary medical doctor.  His total cholesterol is 141 LDL cholesterol is 74 Triglyceride level is 47 HDL is 63.  Oct. 13, 2023 William Bernard is seen for follow up of his CAD . HLD  He brought labs from [facility] office.  He is total cholesterol is 129 HDL is 58 Triglyceride level is 41 LDL is 63. He has been on PCSK9 inhibitorfor 8 years - now on  Repatha  Exercising regularly  Is spinning 4 days a week .     Nov. 12, 2024   William Bernard is seen for follow up of his CAD, HLD  On Repatha   Labs from William Bernard  LDL is 61`  On repatha and zetia    Still spinning 4 days a week Is also doing some running  Has some DOE with running      Past Medical History:  Diagnosis Date   Coronary artery disease    post PTCA and stenting of his LAD in 2005, he is statuts post Cutting Balloon atherectomy of the stent in November 2005     Elevated CPK    Elevated liver enzymes    due to multiple statin medication   Hyperlipidemia     Past Surgical History:  Procedure Laterality Date   CARDIAC CATHETERIZATION  09/23/2004   Subacute thrombosis of the previous long Taxus stent to the mid LAD with residual thrombus -- Residual disease involving the ostium of the first diagonal branch -- Ulcerated plaque involving the right coronary artery.   CORONARY ANGIOPLASTY  09/26/2004    Successful PTCA and cutting balloon of the proximal left anterior descending artery --  Partially successful angioplasty involving the first diagonal vessel We will continue with Integrilin, Plavix.  He will likely have to take Plavix lifelong   CORONARY ANGIOPLASTY WITH STENT PLACEMENT  12/25/2003   Est. EF of 50%. -- stenting of LAD -- Single vessel coronary artery disease involving the LAD and diagonal system    EYE SURGERY     KNEE SURGERY     TONSILLECTOMY     VASECTOMY       Current  Outpatient Medications  Medication Sig Dispense Refill   aspirin 81 MG tablet Take 81 mg by mouth daily.       azelastine (  OPTIVAR) 0.05 % ophthalmic solution Place 2 drops into both eyes 2 (two) times daily.     cholecalciferol (VITAMIN D3) 25 MCG (1000 UNIT) tablet Take by mouth.     clopidogrel (PLAVIX) 75 MG tablet Take 75 mg by mouth daily.       EPINEPHrine 0.3 mg/0.3 mL IJ SOAJ injection INJ UTD PRN     ezetimibe (ZETIA) 10 MG tablet Take 1 tablet (10 mg total) by mouth daily. 90 tablet 0   Fexofenadine HCl (ALLEGRA PO) Take 1 tablet by mouth daily.     fluticasone (FLONASE) 50 MCG/ACT nasal spray Place 1 spray into both nostrils daily.     magnesium oxide (MAG-OX) 400 MG tablet Take by mouth.     REPATHA SURECLICK 140 MG/ML SOAJ Inject  140 mg subcutaneously once every 14 days 6 mL 3   testosterone cypionate (DEPOTESTOSTERONE CYPIONATE) 200 MG/ML injection Inject 0.25 mLs into the muscle once a week.      UNABLE TO FIND once a week. Med Name:Allergy shot     zinc gluconate 50 MG tablet Take 1 tablet by mouth daily.     No current facility-administered medications for this visit.    Allergies:   Niaspan [niacin er (antihyperlipidemic)] and Simvastatin    Social History:  The patient  reports that he has never smoked. He has never used smokeless tobacco. He reports that he does not drink alcohol and does not use drugs.   Family History:  The patient's family history includes Diabetes in his mother; Heart failure in his mother; Hyperlipidemia in his mother; Liver disease in his father; Transient ischemic attack in his mother.    ROS: as per current hx . Otherwise negativ e    Physical Exam: Blood pressure 132/76, pulse 61, height 5' 8.5" (1.74 m), weight 140 lb 6.4 oz (63.7 kg), SpO2 97%.       GEN:  Well nourished, well developed in no acute distress HEENT: Normal NECK: No JVD; No carotid bruits LYMPHATICS: No lymphadenopathy CARDIAC: RRR , no murmurs, rubs,  gallops RESPIRATORY:  Clear to auscultation without rales, wheezing or rhonchi  ABDOMEN: Soft, non-tender, non-distended MUSCULOSKELETAL:  No edema; No deformity  SKIN: Warm and dry NEUROLOGIC:  Alert and oriented x 3    EKG:     EKG Interpretation Date/Time:  Tuesday September 11 2023 13:18:37 EST Ventricular Rate:  61 PR Interval:  164 QRS Duration:  90 QT Interval:  368 QTC Calculation: 370 R Axis:   -20  Text Interpretation: Sinus rhythm with marked sinus arrhythmia When compared with ECG of 26-Sep-2004 12:59, Premature atrial complexes are no longer Present Questionable change in QRS axis Confirmed by William Bernard ([id number]) on 09/11/2023 1:29:27 PM    Recent Labs: No results found for requested labs within last 365 days.    Lipid Panel    Component Value Date/Time   CHOL 153 01/09/2020 0819   TRIG 63 01/09/2020 0819   HDL 71 01/09/2020 0819   CHOLHDL 2.2 01/09/2020 0819   CHOLHDL 2.5 11/17/2015 0951   VLDL 11 11/17/2015 0951   LDLCALC 69 01/09/2020 0819      Wt Readings from Last 3 Encounters:  09/11/23 140 lb 6.4 oz (63.7 kg)  08/11/22 141 lb 9.6 oz (64.2 kg)  09/23/21 64 lb 6.4 oz (29.2 kg)      Other studies Reviewed: Additional studies/ records that were reviewed today include: . Review of the above records demonstrates:    ASSESSMENT AND PLAN:  1.  Coronary artery disease-     no angina .  Works out fairly vigorously on a spin bike 4 times a week.  He is not having any episodes of angina.  2. Hyperlipidemia-   lipids look great.  LDL is 61 .  Continue Repatha and Zetia.           Current medicines are reviewed at length with the patient today.  The patient has concerns regarding medicines.  The following changes have been made:  no change  Labs/ tests ordered today include:   Orders Placed This Encounter  Procedures   EKG 12-Lead   We will see him again in 1 year.  Disposition:      Signed, William Miss, MD  09/11/2023 1:41 PM     [facility] [address] [place], [place], [place]  [address] Phone: [phone]; Fax: [phone]

## 2023-09-11 ENCOUNTER — Encounter: Payer: Self-pay | Admitting: Cardiovascular Disease

## 2023-09-11 ENCOUNTER — Ambulatory Visit: Payer: PPO | Attending: Cardiovascular Disease | Admitting: Cardiovascular Disease

## 2023-09-11 VITALS — BP 132/76 | HR 61 | Ht 68.5 in | Wt 140.4 lb

## 2023-09-11 DIAGNOSIS — E782 Mixed hyperlipidemia: Secondary | ICD-10-CM

## 2023-09-11 DIAGNOSIS — I2510 Atherosclerotic heart disease of native coronary artery without angina pectoris: Secondary | ICD-10-CM | POA: Diagnosis not present

## 2023-09-11 NOTE — Patient Instructions (Signed)
 Follow-Up: At [facility], you and your health needs are our priority.  As part of our continuing mission to provide you with exceptional heart care, we have created designated Provider Care Teams.  These Care Teams include your primary Cardiologist (physician) and Advanced Practice Providers (APPs -  Physician Assistants and Nurse Practitioners) who all work together to provide you with the care you need, when you need it.  Your next appointment:   1 year(s)  Provider:   Mackey Birchwood, MD

## 2023-09-19 ENCOUNTER — Telehealth: Payer: Self-pay | Admitting: Cardiovascular Disease

## 2023-09-19 NOTE — Telephone Encounter (Signed)
Call to patient, reviewed note from 09/11/23 where Dr. Elease Hashimoto states patient LDL in 61 on both repatha and zetia. Patient verbalizes understanding.

## 2023-09-19 NOTE — Telephone Encounter (Signed)
Pt c/o medication issue:  1. Name of Medication: ezetimibe (ZETIA) 10 MG tablet  2. How are you currently taking this medication (dosage and times per day)? As written  3. Are you having a reaction (difficulty breathing--STAT)? no  4. What is your medication issue? PCP told pt to call and see if he needs  to keep taking Zetia if he is taking Repatha. Please advise

## 2023-09-20 ENCOUNTER — Telehealth: Payer: Self-pay | Admitting: Cardiovascular Disease

## 2023-09-20 NOTE — Telephone Encounter (Signed)
New Message:      Patient said he went to his primary doctor yesterday. His doctor wanted him to find out why was he taking Repatha and Zetia?    Pt c/o medication issue:  1. Name of Medication: Repatha and Zetia  2. How are you currently taking this medication (dosage and times per day)?   3. Are you having a reaction (difficulty breathing--STAT)?   4. What is your medication issue? His primary doctor wants him to find out why he is taking Repatha and Zetia

## 2023-09-20 NOTE — Telephone Encounter (Signed)
Pt called to report that his PCP is asking if he needs to be on both Repatha and Zetia.. I sent his last note to DR Luiz Iron and per the pt request.. will send to Dr Elease Hashimoto to be sure he would like to continue both meds pt LDL is good on current plan..57 on 09/18/23.

## 2023-09-21 NOTE — Telephone Encounter (Signed)
Nahser, Deloris Ping, MD  You; Bertram Millard, RN1 hour ago (10:07 AM)    Yes, Please continue Repatha and zetia  PN    Left detailed voicemail per DPR informing pt of above information.

## 2023-09-26 ENCOUNTER — Telehealth: Payer: Self-pay | Admitting: Cardiovascular Disease

## 2023-09-26 ENCOUNTER — Other Ambulatory Visit: Payer: Self-pay

## 2023-09-26 MED ORDER — EZETIMIBE 10 MG PO TABS: 10.0000 mg | ORAL_TABLET | Freq: Every day | ORAL | 3 refills | Status: DC

## 2023-09-26 NOTE — Telephone Encounter (Signed)
RX sent to requested Pharmacy

## 2023-09-26 NOTE — Telephone Encounter (Signed)
*  STAT* If patient is at the pharmacy, call can be transferred to refill team.   1. Which medications need to be refilled? (please list name of each medication and dose if known) ezetimibe (ZETIA) 10 MG tablet   2. Which pharmacy/location (including street and city if local pharmacy) is medication to be sent to? [facility] [place], [place] - [address] [facility] [address]   3. Do they need a 30 day or 90 day supply?   Patient states he believes Dr. Elease Hashimoto sent the medication in, but the mail delivery company hasn't received it. He would like it to be resent.

## 2023-10-29 ENCOUNTER — Other Ambulatory Visit: Payer: Self-pay | Admitting: Cardiovascular Disease

## 2023-10-29 DIAGNOSIS — E782 Mixed hyperlipidemia: Secondary | ICD-10-CM

## 2023-10-29 DIAGNOSIS — I2510 Atherosclerotic heart disease of native coronary artery without angina pectoris: Secondary | ICD-10-CM

## 2023-10-29 DIAGNOSIS — G720 Drug-induced myopathy: Secondary | ICD-10-CM

## 2023-11-12 ENCOUNTER — Telehealth: Payer: Self-pay | Admitting: Cardiovascular Disease

## 2023-11-12 NOTE — Telephone Encounter (Signed)
 Pt called in stating he needs prior auth for Repatha

## 2023-11-13 ENCOUNTER — Other Ambulatory Visit (HOSPITAL_COMMUNITY): Payer: Self-pay

## 2023-11-13 ENCOUNTER — Telehealth: Payer: Self-pay | Admitting: Pharmacy Technician

## 2023-11-13 NOTE — Telephone Encounter (Signed)
 Pharmacy Patient Advocate Encounter   Received notification from Pt Calls Messages that prior authorization for repatha  is required/requested.   Insurance verification completed.   The patient is insured through [facility] ADVANTAGE/RX ADVANCE .   Per test claim: PA required; PA submitted to above mentioned insurance via CoverMyMeds Key/confirmation #/EOC [id number] Status is pending

## 2023-11-14 ENCOUNTER — Other Ambulatory Visit (HOSPITAL_COMMUNITY): Payer: Self-pay

## 2023-11-14 NOTE — Telephone Encounter (Signed)
 Pharmacy Patient Advocate Encounter  Received notification from [employer] that Prior Authorization for repatha  has been APPROVED from 11/13/23 to 11/12/24. Ran test claim, Copay is $47.00 one month. This test claim was processed through [facility]- copay amounts may vary at other pharmacies due to pharmacy/plan contracts, or as the patient moves through the different stages of their insurance plan.   PA #/Case ID/Reference #: [id number]

## 2024-06-19 ENCOUNTER — Other Ambulatory Visit: Payer: Self-pay | Admitting: Pharmacist

## 2024-06-19 DIAGNOSIS — G720 Drug-induced myopathy: Secondary | ICD-10-CM

## 2024-06-19 DIAGNOSIS — I2510 Atherosclerotic heart disease of native coronary artery without angina pectoris: Secondary | ICD-10-CM

## 2024-06-19 DIAGNOSIS — E782 Mixed hyperlipidemia: Secondary | ICD-10-CM

## 2024-06-19 MED ORDER — REPATHA SURECLICK 140 MG/ML ~~LOC~~ SOAJ: 140.0000 mg | SUBCUTANEOUS | 1 refills | Status: AC

## 2024-07-11 ENCOUNTER — Telehealth: Payer: Self-pay | Admitting: Cardiovascular Disease

## 2024-07-11 NOTE — Telephone Encounter (Signed)
 3 month supply sent in on 06/19/24. Patient should contact his pharmacy if there are any issues

## 2024-07-11 NOTE — Telephone Encounter (Signed)
*  STAT* If patient is at the pharmacy, call can be transferred to refill team.   1. Which medications need to be refilled? (please list name of each medication and dose if known)   REPATHA  SURECLICK 140 MG/ML SOAJ   2. Would you like to learn more about the convenience, safety, & potential cost savings by using the [facility]?   3. Are you open to using the [facility] (Type [facility]. ).  4. Which pharmacy/location (including street and city if local pharmacy) is medication to be sent to?  [facility] [place]  - [place], [place] - [address] [facility] [address] [place]   5. Do they need a 30 day or 90 day supply?   30 day  Patient stated he will be out of this medication tomorrow (9/13). Patient has appointment scheduled with Dr. Barbaraann on 11/13.

## 2024-09-02 ENCOUNTER — Other Ambulatory Visit: Payer: Self-pay

## 2024-09-04 MED ORDER — EZETIMIBE 10 MG PO TABS: 10.0000 mg | ORAL_TABLET | Freq: Every day | ORAL | 0 refills | Status: DC

## 2024-09-09 NOTE — Progress Notes (Unsigned)
 Cardiology Office Note:  .   Date:  09/11/2024  ID:  William Bernard, DOB 04-Dec-1945, MRN [id number] PCP: Delilah Murray HERO., MD  [facility] Providers Cardiologist:  Aleene Passe, MD (Inactive) {  History of Present Illness: .    Chief Complaint  Patient presents with   Follow-up         Quintel Mccalla is a 78 y.o. male with history of CAD who presents for follow-up.   History of Present Illness   The patient is a 78 year old with coronary artery disease who presents for follow-up.  He has a history of coronary artery disease and underwent a stent placement in 2005, with subsequent stent thrombosis six months later requiring hospitalization. He is currently taking both aspirin and clopidogrel. No chest pain or trouble breathing.  He has hyperlipidemia, managed with Repatha  and Zetia , resulting in well-controlled cholesterol levels. His last lipid profile showed an LDL of 54. He previously tried various statins but was unable to tolerate them.  He is on testosterone  therapy to aid in muscle building, with a current testosterone  level of 750. He is working with an endocrinologist and takes 0.25 mg doses twice a week.  He maintains an active lifestyle, engaging in weight training four days a week and spinning for three hours weekly. He has a history of running for over 40 years and has recently incorporated weight training to maintain muscle mass.  No history of diabetes, stroke, or high blood pressure. He does not smoke, consumes very little alcohol, and denies drug use. He is retired from [employer], married with three children and five grandchildren.  No chest pain, trouble breathing, or swelling in the legs. Reports some bruising but no significant bleeding issues.           Problem List CAD -PCI LAD 2005 -stent thrombosis 2. HLD -T chol 124, HDL 54, LDL 54, TG 53    ROS: All other ROS reviewed and negative. Pertinent positives noted in the HPI.      Studies Reviewed: SABRA   EKG Interpretation Date/Time:  Thursday September 11 2024 13:45:42 EST Ventricular Rate:  52 PR Interval:  158 QRS Duration:  98 QT Interval:  404 QTC Calculation: 375 R Axis:   -36  Text Interpretation: Sinus bradycardia Left axis deviation Confirmed by Barbaraann Kotyk [phone]) on 09/11/2024 1:56:02 PM   Physical Exam:   VS:  BP (!) 146/90 (BP Location: Left Arm, Patient Position: Sitting, Cuff Size: Normal)   Pulse (!) 52   Ht 5' 8.5 (1.74 m)   Wt 136 lb 9.6 oz (62 kg)   SpO2 98%   BMI 20.47 kg/m    Wt Readings from Last 3 Encounters:  09/11/24 136 lb 9.6 oz (62 kg)  09/11/23 140 lb 6.4 oz (63.7 kg)  08/11/22 141 lb 9.6 oz (64.2 kg)    GEN: Well nourished, well developed in no acute distress NECK: No JVD; No carotid bruits CARDIAC: RRR, no murmurs, rubs, gallops RESPIRATORY:  Clear to auscultation without rales, wheezing or rhonchi  ABDOMEN: Soft, non-tender, non-distended EXTREMITIES:  No edema; No deformity  ASSESSMENT AND PLAN: .   Assessment and Plan    Coronary artery disease status post LAD PCI with history of stent thrombosis Asymptomatic with no ischemic changes on EKG. Dual antiplatelet therapy indicated due to stent thrombosis history. - Discontinued aspirin. - Continue clopidogrel.  Hyperlipidemia on Repatha  and Zetia  Lipid levels well-controlled with LDL at 54. No changes needed. - Continue  Repatha  and Zetia . - Ensure Repatha  authorization is renewed in January.  Testosterone  replacement therapy monitoring Testosterone  level at 750. Discussed maintaining levels within safe range to avoid cardiovascular risks. Endocrinologist involved. - Continue monitoring testosterone  levels with endocrinologist. - Maintain testosterone  levels between 800 and 900.              Follow-up: Return in about 1 year (around 09/11/2025).  Signed, Darryle DASEN. Barbaraann, MD, [facility]  [facility]  [address] [place], [place]  [address] [phone]  2:15 PM

## 2024-09-11 ENCOUNTER — Encounter: Payer: Self-pay | Admitting: Cardiovascular Disease

## 2024-09-11 ENCOUNTER — Ambulatory Visit: Attending: Cardiovascular Disease | Admitting: Cardiovascular Disease

## 2024-09-11 VITALS — BP 146/90 | HR 52 | Ht 68.5 in | Wt 136.6 lb

## 2024-09-11 DIAGNOSIS — I2510 Atherosclerotic heart disease of native coronary artery without angina pectoris: Secondary | ICD-10-CM

## 2024-09-11 DIAGNOSIS — E782 Mixed hyperlipidemia: Secondary | ICD-10-CM | POA: Diagnosis not present

## 2024-09-11 MED ORDER — EZETIMIBE 10 MG PO TABS: 10.0000 mg | ORAL_TABLET | Freq: Every day | ORAL | 3 refills | Status: AC

## 2024-09-11 NOTE — Patient Instructions (Signed)
 Medication Instructions:  Your physician has recommended you make the following change in your medication:   -Stop aspirin.  *If you need a refill on your cardiac medications before your next appointment, please call your pharmacy*   Follow-Up: At [facility], you and your health needs are our priority.  As part of our continuing mission to provide you with exceptional heart care, our providers are all part of one team.  This team includes your primary Cardiologist (physician) and Advanced Practice Providers or APPs (Physician Assistants and Nurse Practitioners) who all work together to provide you with the care you need, when you need it.  Your next appointment:   12 month(s)  Provider:   Jon Hails, PA-C, Callie Goodrich, PA-C, Kathleen Johnson, PA-C, Hao Meng, PA-C, Damien Braver, NP, or Katlyn West, NP

## 2024-09-11 NOTE — Addendum Note (Signed)
 Addended by: LORRENE FEDERICO CROME on: 09/11/2024 02:18 PM   Modules accepted: Orders
